# Patient Record
Sex: Male | Born: 1955
Health system: Southern US, Community
[De-identification: ages and names within clinical notes are randomized; demographics above are authoritative.]

## PROBLEM LIST (undated history)

## (undated) DIAGNOSIS — N401 Enlarged prostate with lower urinary tract symptoms: Secondary | ICD-10-CM

## (undated) DIAGNOSIS — Z9889 Other specified postprocedural states: Secondary | ICD-10-CM

## (undated) DIAGNOSIS — Z8489 Family history of other specified conditions: Secondary | ICD-10-CM

## (undated) DIAGNOSIS — Z973 Presence of spectacles and contact lenses: Secondary | ICD-10-CM

## (undated) DIAGNOSIS — R112 Nausea with vomiting, unspecified: Secondary | ICD-10-CM

## (undated) DIAGNOSIS — R351 Nocturia: Secondary | ICD-10-CM

## (undated) DIAGNOSIS — J9859 Other diseases of mediastinum, not elsewhere classified: Principal | ICD-10-CM

## (undated) DIAGNOSIS — E669 Obesity, unspecified: Secondary | ICD-10-CM

## (undated) DIAGNOSIS — C61 Malignant neoplasm of prostate: Secondary | ICD-10-CM

## (undated) HISTORY — DX: Other diseases of mediastinum, not elsewhere classified: J98.59

---

## 1995-12-05 HISTORY — PX: LAPAROSCOPIC CHOLECYSTECTOMY: SUR755

## 2002-12-04 HISTORY — PX: KNEE ARTHROSCOPY: SUR90

## 2011-12-05 HISTORY — PX: OTHER SURGICAL HISTORY: SHX169

## 2016-01-27 ENCOUNTER — Ambulatory Visit: Payer: BLUE CROSS/BLUE SHIELD | Admitting: Radiation Oncology

## 2016-02-02 ENCOUNTER — Encounter: Payer: Self-pay | Admitting: Radiation Oncology

## 2016-02-02 NOTE — Progress Notes (Signed)
GU Location of Tumor / Histology: prostatic adenocarcinoma  If Prostate Cancer, Gleason Score is (3 + 3) and PSA is (9.6) November 2016  Joe Parrish reports some 15 years ago on a yearly basis his employer checked his PSA and it was elevated then but, he doesn't recall the values.  Biopsies of prostate (if applicable) revealed:    Past/Anticipated interventions by urology, if any: biopsy and referral to Dr. Tammi Klippel  Past/Anticipated interventions by medical oncology, if any: no  Weight changes, if any: no  Bowel/Bladder complaints, if any: nocturia x1 with rapaflo, reports leakage resolved with rapaflo, denies dysuria or hematuria.   Nausea/Vomiting, if any: no  Pain issues, if any:  no  SAFETY ISSUES:  Prior radiation? no  Pacemaker/ICD? no  Possible current pregnancy? no  Is the patient on methotrexate? no  Current Complaints / other details:  60 year old male. Married. Patient most interested in seeds. States, "I become deathly ill after being put to sleep."  PROSTATE VOLUME: 92.98 cc

## 2016-02-03 ENCOUNTER — Ambulatory Visit
Admission: RE | Admit: 2016-02-03 | Discharge: 2016-02-03 | Disposition: A | Discharge status: Home / Self Care | Payer: BLUE CROSS/BLUE SHIELD | Source: Ambulatory Visit | Attending: Radiation Oncology | Admitting: Radiation Oncology

## 2016-02-03 ENCOUNTER — Encounter: Payer: Self-pay | Admitting: Radiation Oncology

## 2016-02-03 VITALS — BP 147/90 | HR 62 | Resp 16 | Ht 75.0 in | Wt 293.8 lb

## 2016-02-03 DIAGNOSIS — C61 Malignant neoplasm of prostate: Secondary | ICD-10-CM | POA: Insufficient documentation

## 2016-02-03 HISTORY — DX: Malignant neoplasm of prostate: C61

## 2016-02-03 NOTE — Progress Notes (Signed)
See progress note under physician encounter. 

## 2016-02-03 NOTE — Progress Notes (Signed)
Radiation Oncology         [phone] ________________________________  Initial Outpatient Consultation  Name: Joe Parrish MRN: [id number]  Date: 02/03/2016  DOB: 11-01-1956  CC:No primary care provider on file.  Kathie Rhodes, MD   REFERRING PHYSICIAN: Kathie Rhodes, MD  DIAGNOSIS: 60 y.o. gentleman with stage T1c adenocarcinoma of the prostate with a Gleason's score of 3+3 and a PSA of 9.6.    ICD-9-CM ICD-10-CM   1. Malignant neoplasm of prostate ([place]) [place] ILLNESS:Joe Parrish is a 60 y.o. gentleman.  He was noted to have an elevated PSA of 9.6 by his primary care physician, Dr. Burnett Sheng.  Accordingly, he was referred for evaluation in urology by Dr. Karsten Ro on 01/06/16,  digital rectal examination was performed at that time did not reveal any nodules.  The patient proceeded to transrectal ultrasound with 12 biopsies of the prostate on 01/12/16.  The prostate volume measured 92.98 cc.  Out of 12 core biopsies, 2 were positive.  The maximum Gleason score was 3+3, and this was seen in the left mid lateral and left apex lateral.  The patient reviewed the biopsy results with his urologist and he has kindly been referred today for discussion of potential radiation treatment options.  PREVIOUS RADIATION THERAPY: No  PAST MEDICAL HISTORY:  Past Medical History  Diagnosis Date  . Prostate cancer [facility])       PAST SURGICAL HISTORY: Past Surgical History  Procedure Laterality Date  . Knee arthroscopy    . Cholecystectomy      FAMILY HISTORY:  Family History  Problem Relation Age of Onset  . Cancer Mother     colon received radiation therapy  . Cancer Other     neice receiving radiation therapy     SOCIAL HISTORY:  reports that he has never smoked. He has never used smokeless tobacco. He reports that he does not drink alcohol or use illicit drugs.  ALLERGIES: Codeine  MEDICATIONS:  Current Outpatient Prescriptions  Medication Sig Dispense  Refill  . silodosin (RAPAFLO) 8 MG CAPS capsule Take 8 mg by mouth daily with breakfast.     No current facility-administered medications for this encounter.    REVIEW OF SYSTEMS:  On review of systems, the patient reports that he is doing well. He denies any chest pain or shortness of breath, fevers or chills. He denies any unintended weight changes. He is not experiencing any chronic cough, nausea, vomiting, difficulty with bowel dysfunction. He is not experiencing any abdominal pain. The patient completed an IPSS and IIEF questionnaire.  His IPSS score was 10 indicating moderate urinary outflow obstructive symptoms, when asked the patient reports that he scored this based on his pre-Flomax/Rapaflo use, though he states that prior to the use of these medications, he did have nocturia 3x, though now he reports once.  He indicated that his erectile function is able to complete sexual activity most attempts. A complete review of systems is obtained and is otherwise negative.  PHYSICAL EXAM:   height is 6\' 3"  (1.905 m) and weight is 293 lb 12.8 oz (133.267 kg). His blood pressure is 147/90 and his pulse is 62. His respiration is 16 and oxygen saturation is 100%.  Pain scale 0/10 In general this is a well appearing Caucasian male in no acute distress. He is alert and oriented x4 and appropriate throughout the examination. HEENT reveals that the patient is normocephalic, atraumatic. EOMs are intact. PERRLA. Skin is intact without  any evidence of gross lesions. Cardiovascular exam reveals a regular rate and rhythm, no clicks rubs or murmurs are auscultated. Chest is clear to auscultation bilaterally. Lymphatic assessment is performed and does not reveal any adenopathy in the cervical, supraclavicular, axillary, or inguinal chains. Abdomen has active bowel sounds in all quadrants and is intact. The abdomen is soft, non tender, non distended. Lower extremities are negative for pretibial pitting edema, deep calf  tenderness, cyanosis or clubbing.  KPS = 100  100 - Normal; no complaints; no evidence of disease. 90   - Able to carry on normal activity; minor signs or symptoms of disease. 80   - Normal activity with effort; some signs or symptoms of disease. 47   - Cares for self; unable to carry on normal activity or to do active work. 60   - Requires occasional assistance, but is able to care for most of his personal needs. 50   - Requires considerable assistance and frequent medical care. 86   - Disabled; requires special care and assistance. 19   - Severely disabled; hospital admission is indicated although death not imminent. 47   - Very sick; hospital admission necessary; active supportive treatment necessary. 10   - Moribund; fatal processes progressing rapidly. 0     - Dead  Karnofsky DA, Abelmann WH, Craver LS and Burchenal JH [phone]) The use of the nitrogen mustards in the palliative treatment of carcinoma: with particular reference to bronchogenic carcinoma Cancer 1 634-56   LABORATORY DATA:  No results found for: WBC, HGB, HCT, MCV, PLT No results found for: NA, K, CL, CO2 No results found for: ALT, AST, GGT, ALKPHOS, BILITOT   RADIOGRAPHY: No results found.    IMPRESSION: This gentleman is a 60 y.o. gentleman with stage T1c adenocarcinoma of the prostate with a Gleason's score of 3+3 and a PSA of 9.6.  His T-Stage, Gleason's Score, and PSA put him into the low risk group.  Accordingly he is eligible for a variety of potential treatment options.  PLAN: Today Dr. Tammi Klippel reviewed the findings and workup thus far.  We discussed the natural history of prostate cancer.  We reviewed the the implications of T-stage, Gleason's Score, and PSA on decision-making and outcomes in prostate cancer.  We discussed radiation treatment in the management of prostate cancer with regard to the logistics and delivery of options of radiotherapy. Currently the patient's prostatic volume excludes him from seed  implant eligibility. Dr. Tammi Klippel discusses that his options would include active surveillance, prostatectomy, external radiotherapy, or the use of ADT to shrink the prostate, and possibly at a later date consider seed implant if his volume were to decrease close to the 60cc range. The patient verbalizes that surveillance, surgery due to down time, and external radiotherapy are not options he would consider. He would like to proceed with ADT and seed implant if feasible.  We discussed the side effect profile of ADT injections, and the rationale for their use. He is interested despite these side effects, and we will contact Dr. Simone Curia office to keep them informed of the patient's decision. Dr. Tammi Klippel discusses that it will likely take 3-4 months before his prostatic size would be at an appropriate size for implant, and that a repeat ultrasound would have to be performed to confirm this. The patient is counseled on the side effects and expectations regarding seed implant. We will plan to see him back in 4 or so months to confirm he is ready to proceed.  We  enjoyed meeting with him today, and will look forward to participating in the care of this very nice gentleman.  The above documentation reflects my direct findings during this shared patient visit. Please see the separate note by Dr. Tammi Klippel on this date for the remainder of the patient's plan of care.  Carola Rhine, PAC   This document serves as a record of services personally performed by Shona Simpson, PAC and  Tyler Pita, MD. It was created on their behalf by Darcus Austin, a trained medical scribe. The creation of this record is based on the scribe's personal observations and the provider's statements to them. This document has been checked and approved by the attending provider.

## 2016-02-03 NOTE — Patient Instructions (Signed)
Contact our office if you have any questions following today's appointment: [phone].  

## 2016-05-16 DIAGNOSIS — C61 Malignant neoplasm of prostate: Secondary | ICD-10-CM | POA: Diagnosis not present

## 2016-05-22 ENCOUNTER — Ambulatory Visit
Admission: RE | Admit: 2016-05-22 | Discharge: 2016-05-22 | Disposition: A | Discharge status: Home / Self Care | Payer: BLUE CROSS/BLUE SHIELD | Source: Ambulatory Visit | Attending: Radiation Oncology | Admitting: Radiation Oncology

## 2016-05-22 ENCOUNTER — Encounter: Payer: Self-pay | Admitting: Medical Oncology

## 2016-05-22 ENCOUNTER — Encounter: Payer: Self-pay | Admitting: Radiation Oncology

## 2016-05-22 VITALS — BP 139/92 | HR 96 | Resp 16 | Wt 297.8 lb

## 2016-05-22 DIAGNOSIS — C61 Malignant neoplasm of prostate: Secondary | ICD-10-CM | POA: Insufficient documentation

## 2016-05-22 NOTE — Progress Notes (Signed)
Oncology Nurse Navigator Documentation  Oncology Nurse Navigator Flowsheets 05/22/2016  Navigator Location [facility]- I met Joe Parrish and his wife today and introduced myself as the Prostate Navigator. I discussed with them my role and gave them my business card.I asked them to call me with any questions or concerns.  Abnormal Finding Date 01/06/2016  Confirmed Diagnosis Date 01/12/2016  Treatment Initiated Date 02/10/2016  Patient Visit Type RadOnc  Treatment Phase Pre-Tx/Tx Discussion- Joe Parrish was seen by Dr. Tammi Klippel 02/03/16. After treatment options were discussed patient opted for brachytherapy. Due to size of prostate he was given Lupron to help decrease the size of his prostate. He returns today to discuss brachytherapy with Dr. Tammi Klippel. I will continue to follow [name] Mom Needs No barriers at this time  Interventions None required  Support Groups/Services Prostate Support Group;Friends and Family  Acuity Level 1  Acuity Level 1 Initial guidance, education and coordination as needed  Time Spent with Patient 30

## 2016-05-22 NOTE — Progress Notes (Signed)
See progress note under physician encounter. 

## 2016-05-22 NOTE — Progress Notes (Signed)
GU Location of Tumor / Histology: prostatic adenocarcinoma   If Prostate Cancer, Gleason Score is (3 + 3) and PSA is (9.6) November 2016  Joe Parrish reports some 15 years ago on a yearly basis his employer checked his PSA and it was elevated then but, he doesn't recall the values.   Biopsies of prostate (if applicable) revealed:   Past/Anticipated interventions by urology, if any: biopsy, referral to Dr. Tammi Klippel, refer back to Dr. Karsten Ro for ADT (Lupron 3 month injection given 02/10/16)   Past/Anticipated interventions by medical oncology, if any: no  Weight changes, if any: no  Bowel/Bladder complaints, if any: nocturia x1 with rapaflo, reports leakage resolved with rapaflo, denies dysuria or hematuria.  Nausea/Vomiting, if any: no  Pain issues, if any: no   SAFETY ISSUES:  Prior radiation? no  Pacemaker/ICD? no  Possible current pregnancy? no  Is the patient on methotrexate? No  Current Complaints / other details: 60 year old male. Married. Patient most interested in seeds. States, "I become deathly ill after being put to sleep."   INITIALLY PROSTATE VOLUME: 92.98 cc AFTER ADT PROSTATE VOLUME IS 70 CC.     Reports since starting ADT he has experience hot flashes and difficulty sleeping. Reports nocturia up to 11 times with water retention since starting ADT. Reports nocturia x 1 if he holds fluids two hours prior to bed time.

## 2016-05-22 NOTE — Progress Notes (Addendum)
Radiation Oncology         [phone] ________________________________  Follow Up New  Name: Joe Parrish MRN: [id number]  Date: 05/22/2016  DOB: [birth year]  CC:No primary care provider on file.  Kathie Rhodes, MD   REFERRING PHYSICIAN: Kathie Rhodes, MD  DIAGNOSIS: 60 y.o. gentleman with stage T1c adenocarcinoma of the prostate with a Gleason's score of 3+3 and a PSA of 9.6.    ICD-9-CM ICD-10-CM   1. Malignant neoplasm of prostate ([place]) [name]:Joe Parrish is a 60 y.o. gentleman who was originially found to have an elevated PSA of 9.6 by his primary care physician, Dr. Burnett Sheng.  Accordingly, he was referred for evaluation in urology by Dr. Karsten Ro on 01/06/16,  digital rectal examination was performed at that time did not reveal any nodules.  The patient proceeded to transrectal ultrasound with 12 biopsies of the prostate on 01/12/16.  The prostate volume measured 92.98 cc.  Out of 12 core biopsies, 2 were positive.  The maximum Gleason score was 3+3, and this was seen in the left mid lateral and left apex lateral.  The patient met with Dr. Tammi Klippel on 3/10/7 and at that time, they discussed the options for radiotherapy. The patient was interested in radioactive seed implant; his gland size was too large to proceed, and he has gone one to receive Lupron to try to reduce the size of the gland in anticipation of seed placement. He received Lupron 30 mg in March 2017 and repeat ultrasound to assess his gland volume revealed that this was 70 cc. He comes today to discuss options further for radiotherapy.  PREVIOUS RADIATION THERAPY: No  PAST MEDICAL HISTORY:  Past Medical History  Diagnosis Date  . Prostate cancer [facility])       PAST SURGICAL HISTORY: Past Surgical History  Procedure Laterality Date  . Knee arthroscopy    . Cholecystectomy      FAMILY HISTORY:  Family History  Problem Relation Age of Onset  . Cancer Mother     colon received  radiation therapy  . Cancer Other     neice receiving radiation therapy     SOCIAL HISTORY:  reports that he has never smoked. He has never used smokeless tobacco. He reports that he does not drink alcohol or use illicit drugs. The patient is married and accompanied by his wife and grandson. He takes care of his grandson during the day.  ALLERGIES: Codeine  MEDICATIONS:  Current Outpatient Prescriptions  Medication Sig Dispense Refill  . tamsulosin (FLOMAX) 0.4 MG CAPS capsule Take 0.4 mg by mouth.     No current facility-administered medications for this encounter.    REVIEW OF SYSTEMS:  On review of systems, the patient reports that he is doing well. He denies any chest pain or shortness of breath, fevers or chills. He denies any unintended weight changes. He is not experiencing any chronic cough, nausea, vomiting, difficulty with bowel dysfunction. He is not experiencing any abdominal pain. The patient completed an IPSS and IIEF questionnaire.  His IPSS score was 10 indicating moderate urinary outflow obstructive symptoms, when asked the patient reports that he scored this based on his pre-Flomax/Rapaflo use, though he states that prior to the use of these medications, he did have nocturia 3x, though now he reports once.  He indicated that his erectile function is able to complete sexual activity most attempts. A complete review of systems is obtained and is otherwise negative.  PHYSICAL  EXAM:   weight is 297 lb 12.8 oz (135.081 kg). His blood pressure is 139/92 and his pulse is 96. His respiration is 16 and oxygen saturation is 97%.  Pain scale 0/10 In general this is a well appearing Caucasian male in no acute distress. He's alert and oriented x4 and appropriate throughout the examination. Cardiopulmonary assessment is negative for acute distress and he exhibits normal effort.   KPS = 100  100 - Normal; no complaints; no evidence of disease. 90   - Able to carry on normal activity;  minor signs or symptoms of disease. 80   - Normal activity with effort; some signs or symptoms of disease. 9   - Cares for self; unable to carry on normal activity or to do active work. 60   - Requires occasional assistance, but is able to care for most of his personal needs. 50   - Requires considerable assistance and frequent medical care. 59   - Disabled; requires special care and assistance. 79   - Severely disabled; hospital admission is indicated although death not imminent. 34   - Very sick; hospital admission necessary; active supportive treatment necessary. 10   - Moribund; fatal processes progressing rapidly. 0     - Dead  Karnofsky DA, Abelmann WH, Craver LS and Burchenal JH [phone]) The use of the nitrogen mustards in the palliative treatment of carcinoma: with particular reference to bronchogenic carcinoma Cancer 1 634-56   LABORATORY DATA:  No results found for: WBC, HGB, HCT, MCV, PLT No results found for: NA, K, CL, CO2 No results found for: ALT, AST, GGT, ALKPHOS, BILITOT   RADIOGRAPHY: No results found.    IMPRESSION: This gentleman is a 60 y.o. gentleman with stage T1c adenocarcinoma of the prostate with a Gleason's score of 3+3 and a PSA of 9.6.  His T-Stage, Gleason's Score, and PSA put him into the low risk group.  Accordingly he is eligible for a variety of potential treatment options.  PLAN:  Dr. Tammi Klippel met back with the patient and his wife today and discusses the most recent imaging findings from his TRUS as well as  reviewed the the implications of T-stage, Gleason's Score, and PSA on decision-making and outcomes in prostate cancer once again.  He discussed radiation treatment in the management of prostate cancer with regard to the logistics and delivery of options of radiotherapy. It appears that ADT has helped with volume reduction of the prostate, and Dr. Tammi Klippel discusses that there may be a need to administer one additional injection of ADT in anticipation of  keeping his gland size stable as his seed implant would be scheduled for several weeks from now. We are looking at August 2017 to be able to perform this. The patient states agreement and understanding.  We discussed the logistics for proceeding and would need to perform a pubic arch study to confirm he is still anatomically a candidate. He states agreement and understanding. We discussed the risks, benefits, recovery time, as well as short and long term effects of radioactive seed implant of the prostate. The patient is interested in proceeding and we will begin the process.    The above documentation reflects my direct findings during this shared patient visit. Please see the separate note by Dr. Tammi Klippel on this date for the remainder of the patient's plan of care.  Carola Rhine, PAC

## 2016-05-23 ENCOUNTER — Other Ambulatory Visit: Payer: Self-pay | Admitting: Urology

## 2016-05-23 ENCOUNTER — Telehealth: Payer: Self-pay | Admitting: *Deleted

## 2016-05-23 NOTE — Telephone Encounter (Signed)
Called patient to pre-seed appt. And implant date, lvm for a return call

## 2016-05-25 NOTE — Addendum Note (Signed)
Encounter addended by: Heywood Footman, RN on: 05/25/2016  9:05 AM<BR>     Documentation filed: Charges VN

## 2016-05-31 ENCOUNTER — Telehealth: Payer: Self-pay | Admitting: *Deleted

## 2016-05-31 NOTE — Telephone Encounter (Signed)
CALLED PATIENT TO REMIND OF PRE-SEED APPT. FOR 06-01-16, SPOKE WITH PATIENT'S WIFE SUZANNE AND SHE IS AWARE OF THESE APPTS.

## 2016-06-01 ENCOUNTER — Ambulatory Visit
Admission: RE | Admit: 2016-06-01 | Discharge: 2016-06-01 | Disposition: A | Discharge status: Home / Self Care | Payer: BLUE CROSS/BLUE SHIELD | Source: Ambulatory Visit | Attending: Radiation Oncology | Admitting: Radiation Oncology

## 2016-06-01 ENCOUNTER — Ambulatory Visit (HOSPITAL_BASED_OUTPATIENT_CLINIC_OR_DEPARTMENT_OTHER)
Admission: RE | Admit: 2016-06-01 | Discharge: 2016-06-01 | Disposition: A | Discharge status: Home / Self Care | Payer: BLUE CROSS/BLUE SHIELD | Source: Ambulatory Visit | Attending: Urology | Admitting: Urology

## 2016-06-01 ENCOUNTER — Encounter (HOSPITAL_BASED_OUTPATIENT_CLINIC_OR_DEPARTMENT_OTHER)
Admission: RE | Admit: 2016-06-01 | Discharge: 2016-06-01 | Disposition: A | Discharge status: Home / Self Care | Payer: BLUE CROSS/BLUE SHIELD | Source: Ambulatory Visit | Attending: Urology | Admitting: Urology

## 2016-06-01 DIAGNOSIS — C61 Malignant neoplasm of prostate: Secondary | ICD-10-CM | POA: Insufficient documentation

## 2016-06-01 DIAGNOSIS — J9859 Other diseases of mediastinum, not elsewhere classified: Secondary | ICD-10-CM | POA: Insufficient documentation

## 2016-06-01 DIAGNOSIS — R918 Other nonspecific abnormal finding of lung field: Secondary | ICD-10-CM | POA: Diagnosis not present

## 2016-06-01 HISTORY — DX: Other diseases of mediastinum, not elsewhere classified: J98.59

## 2016-06-01 NOTE — Progress Notes (Signed)
  Radiation Oncology         [phone] ________________________________  Name: Leor Dinse MRN: [id number]  Date: 06/01/2016  DOB: 01-19-1956  SIMULATION AND TREATMENT PLANNING NOTE PUBIC ARCH STUDY  CC:No primary care provider on file.  Kathie Rhodes, MD  DIAGNOSIS: 60 y.o. gentleman with stage T1c adenocarcinoma of the prostate with a Gleason's score of 3+3 and a PSA of 9.6.     ICD-9-CM ICD-10-CM   1. Malignant neoplasm of prostate ([place]) [place]:  The patient presented today for evaluation for possible prostate seed implant. He was brought to the radiation planning suite and placed supine on the CT couch. A 3-dimensional image study set was obtained in upload to the planning computer. There, on each axial slice, I contoured the prostate gland. Then, using three-dimensional radiation planning tools I reconstructed the prostate in view of the structures from the transperineal needle pathway to assess for possible pubic arch interference. In doing so, I did not appreciate any pubic arch interference. Also, the patient's prostate volume was estimated based on the drawn structure. The volume was 62 cc.  Given the pubic arch appearance and prostate volume, patient remains a good candidate to proceed with prostate seed implant. Today, he freely provided informed written consent to proceed.    PLAN: The patient will undergo prostate seed implant.   ________________________________  Sheral Apley. Tammi Klippel, M.D.

## 2016-06-14 DIAGNOSIS — R918 Other nonspecific abnormal finding of lung field: Secondary | ICD-10-CM | POA: Diagnosis not present

## 2016-06-14 DIAGNOSIS — C61 Malignant neoplasm of prostate: Secondary | ICD-10-CM | POA: Diagnosis not present

## 2016-06-15 ENCOUNTER — Other Ambulatory Visit: Payer: Self-pay | Admitting: Urology

## 2016-06-15 DIAGNOSIS — R918 Other nonspecific abnormal finding of lung field: Secondary | ICD-10-CM

## 2016-06-23 ENCOUNTER — Ambulatory Visit
Admission: RE | Admit: 2016-06-23 | Discharge: 2016-06-23 | Disposition: A | Discharge status: Home / Self Care | Payer: BLUE CROSS/BLUE SHIELD | Source: Ambulatory Visit | Attending: Urology | Admitting: Urology

## 2016-06-23 ENCOUNTER — Other Ambulatory Visit: Payer: Self-pay | Admitting: Urology

## 2016-06-23 DIAGNOSIS — Z01818 Encounter for other preprocedural examination: Secondary | ICD-10-CM | POA: Diagnosis not present

## 2016-06-23 DIAGNOSIS — T7589XA Other specified effects of external causes, initial encounter: Secondary | ICD-10-CM

## 2016-06-23 DIAGNOSIS — R918 Other nonspecific abnormal finding of lung field: Secondary | ICD-10-CM

## 2016-06-23 MED FILL — diazePAM 10 MG TABS: 10 | 1 days supply | Qty: 1 | Fill #0

## 2016-07-04 ENCOUNTER — Other Ambulatory Visit: Payer: BLUE CROSS/BLUE SHIELD

## 2016-07-04 DIAGNOSIS — J069 Acute upper respiratory infection, unspecified: Secondary | ICD-10-CM | POA: Diagnosis not present

## 2016-07-05 ENCOUNTER — Other Ambulatory Visit: Payer: Self-pay

## 2016-07-11 ENCOUNTER — Encounter (HOSPITAL_BASED_OUTPATIENT_CLINIC_OR_DEPARTMENT_OTHER): Payer: Self-pay | Admitting: *Deleted

## 2016-07-11 DIAGNOSIS — C61 Malignant neoplasm of prostate: Secondary | ICD-10-CM | POA: Diagnosis not present

## 2016-07-11 DIAGNOSIS — R3915 Urgency of urination: Secondary | ICD-10-CM | POA: Diagnosis not present

## 2016-07-11 DIAGNOSIS — M1990 Unspecified osteoarthritis, unspecified site: Secondary | ICD-10-CM | POA: Diagnosis not present

## 2016-07-11 DIAGNOSIS — N401 Enlarged prostate with lower urinary tract symptoms: Secondary | ICD-10-CM | POA: Diagnosis not present

## 2016-07-11 LAB — CBC
HCT: 45.8 % (ref 39.0–52.0)
Hemoglobin: 15.2 g/dL (ref 13.0–17.0)
MCH: 30.9 pg (ref 26.0–34.0)
MCHC: 33.2 g/dL (ref 30.0–36.0)
MCV: 93.1 fL (ref 78.0–100.0)
Platelets: 276 10*3/uL (ref 150–400)
RBC: 4.92 MIL/uL (ref 4.22–5.81)
RDW: 13.8 % (ref 11.5–15.5)
WBC: 7.3 10*3/uL (ref 4.0–10.5)

## 2016-07-11 LAB — COMPREHENSIVE METABOLIC PANEL
ALT: 25 U/L (ref 17–63)
AST: 19 U/L (ref 15–41)
Albumin: 4.5 g/dL (ref 3.5–5.0)
Alkaline Phosphatase: 94 U/L (ref 38–126)
Anion gap: 9 (ref 5–15)
BUN: 15 mg/dL (ref 6–20)
CO2: 29 mmol/L (ref 22–32)
Calcium: 9.5 mg/dL (ref 8.9–10.3)
Chloride: 102 mmol/L (ref 101–111)
Creatinine, Ser: 1.03 mg/dL (ref 0.61–1.24)
GFR calc Af Amer: >60 mL/min (ref >60)
GFR calc non Af Amer: >60 mL/min (ref >60)
Glucose, Bld: 118 mg/dL — ABNORMAL HIGH (ref 65–99)
Potassium: 4.4 mmol/L (ref 3.5–5.1)
Sodium: 140 mmol/L (ref 135–145)
Total Bilirubin: 0.7 mg/dL (ref 0.3–1.2)
Total Protein: 7.6 g/dL (ref 6.5–8.1)

## 2016-07-11 LAB — APTT: aPTT: 32 seconds (ref 24–36)

## 2016-07-11 LAB — PROTIME-INR
INR: 1.02
Prothrombin Time: 13.4 seconds (ref 11.4–15.2)

## 2016-07-11 NOTE — Progress Notes (Signed)
NPO AFTER MN.  ARRIVE AT 0900.  CURRENT LAB RESULTS, EKG, AND CXR IN CHART AND EPIC.  WILL DO HIBICLENS SHOWER AM DOS.

## 2016-07-12 ENCOUNTER — Ambulatory Visit
Admission: RE | Admit: 2016-07-12 | Discharge: 2016-07-12 | Disposition: A | Discharge status: Home / Self Care | Payer: BLUE CROSS/BLUE SHIELD | Source: Ambulatory Visit | Attending: Urology | Admitting: Urology

## 2016-07-12 DIAGNOSIS — R222 Localized swelling, mass and lump, trunk: Secondary | ICD-10-CM | POA: Diagnosis not present

## 2016-07-12 MED ORDER — GADOBENATE DIMEGLUMINE 529 MG/ML IV SOLN
20.0000 mL | Freq: Once | INTRAVENOUS | Status: AC | PRN
  Administered 2016-07-12: 20 mL via INTRAVENOUS

## 2016-07-14 ENCOUNTER — Ambulatory Visit: Admission: RE | Admit: 2016-07-14 | Payer: BLUE CROSS/BLUE SHIELD | Source: Ambulatory Visit

## 2016-07-14 DIAGNOSIS — C61 Malignant neoplasm of prostate: Secondary | ICD-10-CM | POA: Diagnosis not present

## 2016-07-16 NOTE — H&P (Signed)
HPI: Joe Parrish is a 60 year-old male established patient with prostate cancer.  His prostate cancer was diagnosed 01/12/2016. His PSA at his time of diagnosis was 9.6.   He has not undergone surgery for treatment. He has not undergone External Beam Radiation Therapy for treatment. He has undergone Hormonal Therapy for treatment.   Because of his prostate volume of 93 cc he received a 3 month Lupron injection on 02/10/16 in order to downsize his prostate in preparation for radioactive seed implantation.   He said his greatest complaint is that the Lupron has caused difficulty sleeping at night. He had contacted the office and I recommended the use of Benadryl and he took this and noted it resulted in improvement in his ability to fall sleep but he read on the bottle but it should not be taken by man with prostate conditions and stopped taking the medication. He indicated however that he did not notice any worsening of his voiding symptoms when he took the medication.     ALLERGIES: Codeine Derivatives    MEDICATIONS: Rapaflo 8 MG Oral Capsule 0 Oral  Tamsulosin HCl - 0.4 MG Oral Capsule 0 Oral     GU PSH: None     PSH Notes: Arthroscopy Knee, Cholecystectomy, Colonoscopy (Fiberoptic)   NON-GU PSH: Cholecystectomy - 01/06/2016 Diagnostic Colonoscopy - 01/06/2016 Knee Arthroscopy; Dx - 01/06/2016    GU PMH: Prostate Cancer, Adenocarcinoma of prostate - 02/10/2016 BPH w/LUTS, Benign prostatic hyperplasia (BPH) with urinary urgency - 01/12/2016 Elevated PSA, Elevated PSA - 01/12/2016      PMH Notes: Adenocarcinoma of the prostate: He was noted have a PSA of 9.6 in 11/16. A previous PSA in 2012 was 4.3. He reported that he used to have his PSA done by his employer on a yearly basis and he said he believes that 15 years ago his PSA was elevated but he does not recall values. That information is no longer available.  TRUS/BX 01/12/16: Prostate volume - 93 cc  Pathology: Adenocarcinoma Gleason score 3+3  = 6 in 2 cores from the right lobe involving 20% and 5% of the cores.  Stage: T1c     BPH with LUTS: He does have some mild voiding symptoms consisting of some urgency occasionally when he hears water and a weak stream at night but a good strong stream throughout the day. He said in the evenings if he drinks water he will not get up at all at night. Other liquids will cause him to have to get up. He also has some mild postvoid dribbling.     NON-GU PMH: Encounter for general adult medical examination without abnormal findings, Encounter for preventive health examination - 01/24/2016 Personal history of colonic polyps, History of colonic polyps - 01/06/2016 Personal history of other diseases of the musculoskeletal system and connective tissue, History of arthritis - 01/06/2016    FAMILY HISTORY: Colon Cancer - Runs In Family Death - Runs In Family   SOCIAL HISTORY: Marital Status: Married Patient has never smoked.  Has never drank.  Drinks 1 caffeinated drink per day.     Notes: Alcohol use, Caffeine use, Married, Never a smoker   REVIEW OF SYSTEMS:    GU Review Male:   Patient reports get up at night to urinate and leakage of urine. Patient denies frequent urination, hard to postpone urination, burning/ pain with urination, stream starts and stops, trouble starting your stream, have to strain to urinate , erection problems, and penile pain.  Gastrointestinal (Upper):  Patient denies vomiting, nausea, and indigestion/ heartburn.  Gastrointestinal (Lower):   Patient denies diarrhea and constipation.  Constitutional:   Patient denies fever, night sweats, weight loss, and fatigue.  Skin:   Patient denies skin rash/ lesion and itching.  Eyes:   Patient denies blurred vision and double vision.  Ears/ Nose/ Throat:   Patient denies sore throat and sinus problems.  Hematologic/Lymphatic:   Patient denies swollen glands and easy bruising.  Cardiovascular:   Patient denies leg swelling and chest  pains.  Respiratory:   Patient denies cough and shortness of breath.  Endocrine:   Patient denies excessive thirst.  Musculoskeletal:   Patient reports joint pain. Patient denies back pain.  Neurological:   Patient denies headaches and dizziness.  Psychologic:   Patient denies depression and anxiety.   Notes: weight gain   VITAL SIGNS:    Weight: 29 lb/13.2 kg   Height/Length: 75 in / 190 cm   BP: 129/79 mmHg   Pulse: 77 /min   Temp: 97.8 F / 37 C   BMI: 3.6       Physical Exam  Constitutional: Well nourished and well developed . No acute distress.   ENT:. The ears and nose are normal in appearance.   Neck: The appearance of the neck is normal and no neck mass is present.   Pulmonary: No respiratory distress and normal respiratory rhythm and effort.   Cardiovascular: Heart rate and rhythm are normal . No peripheral edema.   Abdomen: The abdomen is soft and nontender. No masses are palpated. No CVA tenderness. No hernias are palpable. No hepatosplenomegaly noted.   Rectal: Rectal exam demonstrates normal sphincter tone, no tenderness and no masses. The prostate is smooth and flat . Estimated prostate size is 2+. The prostate has no nodularity and is not tender. The left seminal vesicle is nonpalpable. The right seminal vesicle is nonpalpable. The perineum is normal on inspection.   Genitourinary: Examination of the penis demonstrates no discharge, no masses, no lesions and a normal meatus. The penis is uncircumcised. The scrotum is without lesions. The right epididymis is palpably normal and non-tender. The left epididymis is palpably normal and non-tender. The right testis is non-tender and without masses. The left testis is non-tender and without masses.   Lymphatics: The femoral and inguinal nodes are not enlarged or tender.   Skin: Normal skin turgor, no visible rash and no visible skin lesions.   Neuro/Psych:. Mood and affect are appropriate.    PAST DATA REVIEWED:   Source Of History:  Outside Source  Lab Test Review:   BUN, Creatinine and GFR  Records Review:   Previous Patient Records, POC Tool  Notes:                     I note on his POC that his Creatinine in 11/16 was 1.01   PROCEDURES:         Prostate Ultrasound done 05/16/16 Length: 5.58cm Height: 4.05cm Width: 5.96cm Volume: 70.5 ML  Bladder:    Prostate:  Subcentimeter cystic area seen as well as calcifications.       The transrectal ultrasound probe is introduced into the rectum, and the prostate is visualized. Ultrasonography is utilized throughout the procedure. At the conclusion of the procedure, the ultrasound probe is removed. The patient tolerates the procedure without complication.   ASSESSMENT:      ICD-10 Details  1 GU:   Prostate Cancer - C61 Stable - He is on Lupron  so we discussed checking his PSA and testosterone level.   2   BPH w/LUTS - N40.1 Stable - his voiding symptoms were not worsened by Benadryl so I told him he could restart that.  His prostate is 70 cc. He should be implantable.   We discussed the fact that as the Lupron effect wear off he would begin to have less difficulty sleeping at night but in the meantime I did tell him he could take the Benadryl. In addition he asked about his libido and I told him that that would return as well. I am going to check a serum testosterone todayto serve as a baseline.    PLAN:     I-125 seed implant

## 2016-07-17 ENCOUNTER — Ambulatory Visit (HOSPITAL_COMMUNITY): Payer: BLUE CROSS/BLUE SHIELD

## 2016-07-17 ENCOUNTER — Encounter (HOSPITAL_BASED_OUTPATIENT_CLINIC_OR_DEPARTMENT_OTHER)
Admission: RE | Disposition: A | Discharge status: Home / Self Care | Payer: Self-pay | Source: Ambulatory Visit | Attending: Urology

## 2016-07-17 ENCOUNTER — Ambulatory Visit (HOSPITAL_BASED_OUTPATIENT_CLINIC_OR_DEPARTMENT_OTHER): Payer: BLUE CROSS/BLUE SHIELD | Admitting: Anesthesiology

## 2016-07-17 ENCOUNTER — Ambulatory Visit (HOSPITAL_BASED_OUTPATIENT_CLINIC_OR_DEPARTMENT_OTHER)
Admission: RE | Admit: 2016-07-17 | Discharge: 2016-07-17 | Disposition: A | Discharge status: Home / Self Care | Payer: BLUE CROSS/BLUE SHIELD | Source: Ambulatory Visit | Attending: Urology | Admitting: Urology

## 2016-07-17 ENCOUNTER — Encounter (HOSPITAL_BASED_OUTPATIENT_CLINIC_OR_DEPARTMENT_OTHER): Payer: Self-pay | Admitting: Certified Registered"

## 2016-07-17 DIAGNOSIS — C61 Malignant neoplasm of prostate: Secondary | ICD-10-CM | POA: Diagnosis not present

## 2016-07-17 DIAGNOSIS — N401 Enlarged prostate with lower urinary tract symptoms: Secondary | ICD-10-CM | POA: Insufficient documentation

## 2016-07-17 DIAGNOSIS — R3915 Urgency of urination: Secondary | ICD-10-CM | POA: Insufficient documentation

## 2016-07-17 DIAGNOSIS — M1990 Unspecified osteoarthritis, unspecified site: Secondary | ICD-10-CM | POA: Diagnosis not present

## 2016-07-17 HISTORY — DX: Other specified postprocedural states: Z98.890

## 2016-07-17 HISTORY — DX: Family history of other specified conditions: Z84.89

## 2016-07-17 HISTORY — PX: RADIOACTIVE SEED IMPLANT: SHX5150

## 2016-07-17 HISTORY — DX: Presence of spectacles and contact lenses: Z97.3

## 2016-07-17 HISTORY — DX: Benign prostatic hyperplasia with lower urinary tract symptoms: N40.1

## 2016-07-17 HISTORY — DX: Nausea with vomiting, unspecified: R11.2

## 2016-07-17 SURGERY — INSERTION, RADIATION SOURCE, PROSTATE
Anesthesia: General

## 2016-07-17 MED ORDER — LIDOCAINE HCL 1 % IJ SOLN
INTRAMUSCULAR | Status: DC | PRN
  Administered 2016-07-17: 100 mg

## 2016-07-17 MED ORDER — ONDANSETRON HCL 4 MG/2ML IJ SOLN
INTRAMUSCULAR | Status: AC
  Filled 2016-07-17: qty 2

## 2016-07-17 MED ORDER — PROPOFOL 10 MG/ML IV BOLUS
INTRAVENOUS | Status: AC
  Filled 2016-07-17: qty 40

## 2016-07-17 MED ORDER — SCOPOLAMINE 1 MG/3DAYS TD PT72
1.0000 | MEDICATED_PATCH | TRANSDERMAL | Status: DC
  Administered 2016-07-17: 1.5 mg via TRANSDERMAL
  Filled 2016-07-17: qty 1

## 2016-07-17 MED ORDER — ROCURONIUM BROMIDE 100 MG/10ML IV SOLN
INTRAVENOUS | Status: AC
  Filled 2016-07-17: qty 2

## 2016-07-17 MED ORDER — MIDAZOLAM HCL 5 MG/5ML IJ SOLN
INTRAMUSCULAR | Status: DC | PRN
  Administered 2016-07-17: 2 mg via INTRAVENOUS

## 2016-07-17 MED ORDER — PROPOFOL 500 MG/50ML IV EMUL
INTRAVENOUS | Status: AC
  Filled 2016-07-17: qty 50

## 2016-07-17 MED ORDER — SUGAMMADEX SODIUM 200 MG/2ML IV SOLN
INTRAVENOUS | Status: DC | PRN
  Administered 2016-07-17: 200 mg via INTRAVENOUS
  Administered 2016-07-17: 400 mg via INTRAVENOUS

## 2016-07-17 MED ORDER — CIPROFLOXACIN IN D5W 400 MG/200ML IV SOLN
400.0000 mg | INTRAVENOUS | Status: AC
  Administered 2016-07-17: 400 mg via INTRAVENOUS
  Filled 2016-07-17: qty 200

## 2016-07-17 MED ORDER — PROPOFOL 500 MG/50ML IV EMUL
INTRAVENOUS | Status: DC | PRN
  Administered 2016-07-17: 150 ug/kg/min via INTRAVENOUS

## 2016-07-17 MED ORDER — FENTANYL CITRATE (PF) 100 MCG/2ML IJ SOLN
INTRAMUSCULAR | Status: AC
  Filled 2016-07-17: qty 2

## 2016-07-17 MED ORDER — HYDROCODONE-ACETAMINOPHEN 10-325 MG PO TABS: 1.0000 | ORAL_TABLET | ORAL | 0 refills | Status: DC | PRN

## 2016-07-17 MED ORDER — CIPROFLOXACIN IN D5W 400 MG/200ML IV SOLN
INTRAVENOUS | Status: AC
  Filled 2016-07-17: qty 200

## 2016-07-17 MED ORDER — SCOPOLAMINE 1 MG/3DAYS TD PT72
MEDICATED_PATCH | TRANSDERMAL | Status: AC
  Filled 2016-07-17: qty 1

## 2016-07-17 MED ORDER — ONDANSETRON HCL 4 MG/2ML IJ SOLN
INTRAMUSCULAR | Status: DC | PRN
  Administered 2016-07-17 (×2): 4 mg via INTRAVENOUS

## 2016-07-17 MED ORDER — MIDAZOLAM HCL 2 MG/2ML IJ SOLN
INTRAMUSCULAR | Status: AC
  Filled 2016-07-17: qty 2

## 2016-07-17 MED ORDER — FENTANYL CITRATE (PF) 100 MCG/2ML IJ SOLN
INTRAMUSCULAR | Status: DC | PRN
  Administered 2016-07-17: 50 ug via INTRAVENOUS

## 2016-07-17 MED ORDER — STERILE WATER FOR IRRIGATION IR SOLN
Status: DC | PRN
  Administered 2016-07-17: 500 mL

## 2016-07-17 MED ORDER — DEXAMETHASONE SODIUM PHOSPHATE 10 MG/ML IJ SOLN
INTRAMUSCULAR | Status: DC | PRN
  Administered 2016-07-17: 10 mg via INTRAVENOUS

## 2016-07-17 MED ORDER — PROMETHAZINE HCL 25 MG/ML IJ SOLN
6.2500 mg | INTRAMUSCULAR | Status: DC | PRN
  Filled 2016-07-17: qty 1

## 2016-07-17 MED ORDER — FENTANYL CITRATE (PF) 100 MCG/2ML IJ SOLN
25.0000 ug | INTRAMUSCULAR | Status: DC | PRN
  Administered 2016-07-17 (×2): 25 ug via INTRAVENOUS
  Filled 2016-07-17: qty 1

## 2016-07-17 MED ORDER — ROCURONIUM BROMIDE 100 MG/10ML IV SOLN
INTRAVENOUS | Status: DC | PRN
  Administered 2016-07-17: 10 mg via INTRAVENOUS
  Administered 2016-07-17: 70 mg via INTRAVENOUS

## 2016-07-17 MED ORDER — DEXAMETHASONE SODIUM PHOSPHATE 10 MG/ML IJ SOLN
INTRAMUSCULAR | Status: AC
  Filled 2016-07-17: qty 1

## 2016-07-17 MED ORDER — SUGAMMADEX SODIUM 200 MG/2ML IV SOLN
INTRAVENOUS | Status: AC
  Filled 2016-07-17: qty 4

## 2016-07-17 MED ORDER — PROPOFOL 500 MG/50ML IV EMUL
INTRAVENOUS | Status: AC
  Filled 2016-07-17: qty 100

## 2016-07-17 MED ORDER — PROPOFOL 10 MG/ML IV BOLUS
INTRAVENOUS | Status: DC | PRN
  Administered 2016-07-17: 300 mg via INTRAVENOUS
  Administered 2016-07-17: 25 mg via INTRAVENOUS

## 2016-07-17 MED ORDER — LACTATED RINGERS IV SOLN
INTRAVENOUS | Status: DC
  Administered 2016-07-17: 10:00:00 via INTRAVENOUS
  Filled 2016-07-17: qty 1000

## 2016-07-17 MED ORDER — IOHEXOL 300 MG/ML  SOLN
INTRAMUSCULAR | Status: DC | PRN
  Administered 2016-07-17: 3 mL

## 2016-07-17 MED ORDER — FLEET ENEMA 7-19 GM/118ML RE ENEM
1.0000 | ENEMA | Freq: Once | RECTAL | Status: AC
  Administered 2016-07-17: 1 via RECTAL
  Filled 2016-07-17: qty 1

## 2016-07-17 SURGICAL SUPPLY — 24 items
BAG URINE DRAINAGE (UROLOGICAL SUPPLIES) ×2 IMPLANT
BLADE CLIPPER SURG (BLADE) ×2 IMPLANT
CATH FOLEY 2WAY SLVR  5CC 16FR (CATHETERS) ×2
CATH FOLEY 2WAY SLVR 5CC 16FR (CATHETERS) ×2 IMPLANT
CATH ROBINSON RED A/P 20FR (CATHETERS) ×2 IMPLANT
CLOTH BEACON ORANGE TIMEOUT ST (SAFETY) ×2 IMPLANT
COVER BACK TABLE 60X90IN (DRAPES) ×2 IMPLANT
COVER MAYO STAND STRL (DRAPES) ×2 IMPLANT
DRSG TEGADERM 4X4.75 (GAUZE/BANDAGES/DRESSINGS) ×2 IMPLANT
DRSG TEGADERM 8X12 (GAUZE/BANDAGES/DRESSINGS) ×2 IMPLANT
GLOVE BIO SURGEON STRL SZ8 (GLOVE) ×4 IMPLANT
GLOVE ECLIPSE 8.0 STRL XLNG CF (GLOVE) ×10 IMPLANT
GOWN STRL REUS W/ TWL XL LVL3 (GOWN DISPOSABLE) ×1 IMPLANT
GOWN STRL REUS W/TWL XL LVL3 (GOWN DISPOSABLE) ×1
HOLDER FOLEY CATH W/STRAP (MISCELLANEOUS) ×2 IMPLANT
IV NS 1000ML (IV SOLUTION) ×1
IV NS 1000ML BAXH (IV SOLUTION) ×1 IMPLANT
KIT ROOM TURNOVER WOR (KITS) ×2 IMPLANT
PACK CYSTO (CUSTOM PROCEDURE TRAY) ×2 IMPLANT
SYRINGE 10CC LL (SYRINGE) ×2 IMPLANT
TUBE CONNECTING 12X1/4 (SUCTIONS) IMPLANT
UNDERPAD 30X30 INCONTINENT (UNDERPADS AND DIAPERS) ×4 IMPLANT
WATER STERILE IRR 500ML POUR (IV SOLUTION) ×4 IMPLANT
selectSeed I-125 ×190 IMPLANT

## 2016-07-17 NOTE — Progress Notes (Signed)
  Radiation Oncology         [phone] ________________________________  Name: Joe Parrish MRN: [id number]  Date: 07/17/2016  DOB: May 06, 1956       Prostate Seed Implant  CC:No primary care provider on file.  No ref. provider found  DIAGNOSIS: 60 y.o. gentleman with stage T1c adenocarcinoma of the prostate with a Gleason's score of 3+3 and a PSA of 9.6    ICD-9-CM ICD-10-CM   1. Prostate cancer ([place]) 185 C61 DG Chest 2 View     DG Chest 2 View     Discharge patient    PROCEDURE: Insertion of radioactive I-125 seeds into the prostate gland.  RADIATION DOSE: 145 Gy, definitive therapy.  TECHNIQUE: Joe Parrish was brought to the operating room with the urologist. He was placed in the dorsolithotomy position. He was catheterized and a rectal tube was inserted. The perineum was shaved, prepped and draped. The ultrasound probe was then introduced into the rectum to see the prostate gland.  TREATMENT DEVICE: A needle grid was attached to the ultrasound probe stand and anchor needles were placed.  3D PLANNING: The prostate was imaged in 3D using a sagittal sweep of the prostate probe. These images were transferred to the planning computer. There, the prostate, urethra and rectum were defined on each axial reconstructed image. Then, the software created an optimized 3D plan and a few seed positions were adjusted. The quality of the plan was reviewed using [facility] information for the target and the following two organs at risk:  Urethra and Rectum.  Then the accepted plan was uploaded to the seed Selectron afterloading unit.  PROSTATE VOLUME STUDY:  Using transrectal ultrasound the volume of the prostate was verified to be 89 cc.  SPECIAL TREATMENT PROCEDURE/SUPERVISION AND HANDLING: The Nucletron FIRST system was used to place the needles under sagittal guidance. A total of 23 needles were used to deposit 95 seeds in the prostate gland. The total seed activity was 74.005 mCi.  COMPLEX  SIMULATION: At the end of the procedure, an anterior radiograph of the pelvis was obtained to document seed positioning and count. Cystoscopy was performed to check the urethra and bladder.  MICRODOSIMETRY: At the end of the procedure, the patient was emitting 0.19 mR/hr at 1 meter. Accordingly, he was considered safe for hospital discharge.  PLAN: The patient will return to the radiation oncology clinic for post implant CT dosimetry in three weeks.   ________________________________  Sheral Apley Tammi Klippel, M.D.

## 2016-07-17 NOTE — Anesthesia Preprocedure Evaluation (Addendum)
Anesthesia Evaluation  Patient identified by MRN, date of birth, ID band Patient awake    Reviewed: Allergy & Precautions, H&P , NPO status , Patient's Chart, lab work & pertinent test results  History of Anesthesia Complications (+) PONV and history of anesthetic complications  Airway Mallampati: II  TM Distance: >3 FB Neck ROM: full    Dental no notable dental hx. (+) Teeth Intact,    Pulmonary neg pulmonary ROS,    Pulmonary exam normal breath sounds clear to auscultation       Cardiovascular negative cardio ROS Normal cardiovascular exam Rhythm:regular Rate:Normal     Neuro/Psych negative neurological ROS     GI/Hepatic negative GI ROS, Neg liver ROS,   Endo/Other  negative endocrine ROS  Renal/GU negative Renal ROS     Musculoskeletal   Abdominal   Peds  Hematology negative hematology ROS (+)   Anesthesia Other Findings   Reproductive/Obstetrics negative OB ROS                            Anesthesia Physical Anesthesia Plan  ASA: II  Anesthesia Plan: General   Post-op Pain Management:    Induction: Intravenous  Airway Management Planned: Oral ETT  Additional Equipment:   Intra-op Plan:   Post-operative Plan: Extubation in OR  Informed Consent: I have reviewed the patients History and Physical, chart, labs and discussed the procedure including the risks, benefits and alternatives for the proposed anesthesia with the patient or authorized representative who has indicated his/her understanding and acceptance.   Dental Advisory Given  Plan Discussed with: Anesthesiologist, CRNA and Surgeon  Anesthesia Plan Comments: (PONV: recommend preop scop patch + intraop decadron, zofran, IV hydration and propofol based anesthetic, with no volatile or N2O per ASA guidelines for patients with hx of severe PONV)        Anesthesia Quick Evaluation

## 2016-07-17 NOTE — Transfer of Care (Signed)
Immediate Anesthesia Transfer of Care Note  Patient: Joe Parrish  Procedure(s) Performed: Procedure(s) with comments: RADIOACTIVE SEED IMPLANT/BRACHYTHERAPY IMPLANT (N/A) - 95   seeds implanted;   no  seeds observed in bladder  Patient Location: PACU  Anesthesia Type:General  Level of Consciousness: awake, alert , oriented and patient cooperative  Airway & Oxygen Therapy: Patient Spontanous Breathing and Patient connected to nasal cannula oxygen  Post-op Assessment: Report given to RN and Post -op Vital signs reviewed and stable  Post vital signs: Reviewed and stable  Last Vitals:  Vitals:   07/17/16 1239 07/17/16 1245  BP: 136/89 (!) 146/83  Pulse: 73 74  Resp: 14 15  Temp: 36.6 C     Last Pain:  Vitals:   07/17/16 1239  TempSrc:   PainSc: 0-No pain      Patients Stated Pain Goal: 7 ([id number])  Complications: No apparent anesthesia complications

## 2016-07-17 NOTE — Discharge Instructions (Signed)
Post Anesthesia Home Care Instructions  Activity: Get plenty of rest for the remainder of the day. A responsible adult should stay with you for 24 hours following the procedure.  For the next 24 hours, DO NOT: -Drive a car -Paediatric nurse -Drink alcoholic beverages -Take any medication unless instructed by your physician -Make any legal decisions or sign important papers.  Meals: Start with liquid foods such as gelatin or soup. Progress to regular foods as tolerated. Avoid greasy, spicy, heavy foods. If nausea and/or vomiting occur, drink only clear liquids until the nausea and/or vomiting subsides. Call your physician if vomiting continues.  Special Instructions/Symptoms: Your throat may feel dry or sore from the anesthesia or the breathing tube placed in your throat during surgery. If this causes discomfort, gargle with warm salt water. The discomfort should disappear within 24 hours.  If you had a scopolamine patch placed behind your ear for the management of post- operative nausea and/or vomiting:  1. The medication in the patch is effective for 72 hours, after which it should be removed.  Wrap patch in a tissue and discard in the trash. Wash hands thoroughly with soap and water. 2. You may remove the patch earlier than 72 hours if you experience unpleasant side effects which may include dry mouth, dizziness or visual disturbances. 3. Avoid touching the patch. Wash your hands with soap and water after contact with the patch.   Radioactive Seed Implant Home Care Instructions   Activity:    Rest for the remainder of the day.  Do not drive or operate equipment today.  You may resume normal  activities in a few days as instructed by your physician, without risk of harmful radiation exposure to those around you, provided you follow the time and distance precautions on the Radiation Oncology Instruction Sheet.   Meals: Drink plenty of lipuids and eat light foods, such as gelatin or  soup this evening .  You may return to normal meal plan tomorrow.  Return To Work: You may return to work as instructed by Naval [employer].  Special Instruction:   If any seeds are found, use tweezers to pick up seeds and place in a glass container of any kind and bring to your physician's office.  Call your physician if any of these symptoms occur:   Persistent or heavy bleeding  Urine stream diminishes or stops completely after catheter is removed  Fever equal to or greater than 101 degrees F  Cloudy urine with a strong foul odor  Severe pain  You may feel some burning pain and/or hesitancy when you urinate after the catheter is removed.  These symptoms may increase over the next few weeks, but should diminish within forur to six weeks.  Applying moist heat to the lower abdomen or a hot tub bath may help relieve the pain.  If the discomfort becomes severe, please call your physician for additional medications.  Follow-up (Date of Return Visit to Physician):  Patient:_______________________________   @DATE @  Nurse:________________________________ @DATE @ [place]  Removal of catheter Remove the foley catheter after 24 hours ( day after the procedure).can be done easily by cutting the side port of the catheter, which will allow the balloon to deflate.  You will see 1-2 teaspoons of clear water as the balloon deflates and then the catheter can be slid out without difficulty.        Cut here  Antibiotics You may be given a prescription for an antibiotic to take  when you arrive home. If so, be sure to take every tablet in the bottle, even if you are feeling better before the prescription is finished. If you begin itching, notice a rash or start to swell on your trunk, arms, legs and/or throat, immediately stop taking the antibiotic and call your Urologist. Diet Resume your usual diet when you return home. To keep your bowels moving easily  and softly, drink prune, apple and cranberry juice at room temperature. You may also take a stool softener, such as Colace, which is available without prescription at local pharmacies. Daily activities ? No driving or heavy lifting for at least two days after the implant. ? No bike riding, horseback riding or riding lawn mowers for the first month after the implant. ? Any strenuous physical activity should be approved by your doctor before you resume it. Sexual relations You may resume sexual relations two weeks after the procedure. A condom should be used for the first two weeks. Your semen may be dark brown or black; this is normal and is related bleeding that may have occurred during the implant. Postoperative swelling Expect swelling and bruising of the scrotum and perineum (the area between the scrotum and anus). Both the swelling and the bruising should resolve in l or 2 weeks. Ice packs and over- the-counter medications such as Tylenol, Advil or Aleve may lessen your discomfort. Postoperative urination Most men experience burning on urination and/or urinary frequency. If this becomes bothersome, contact your Urologist.  Medication can be prescribed to relieve these problems.  It is normal to have some blood in your urine for a few days after the implant. Special instructions related to the seeds It is unlikely that you will pass an Iodine-125 seed in your urine. The seeds are silver in color and are about as large as a grain of rice. If you pass a seed, do not handle it with your fingers. Use a spoon to place it in an envelope or jar in place this in base occluded area such as the garage or basement for return to the radiation clinic at your convenience.  Contact your doctor for ? Temperature greater than 101 F ? Increasing pain ? Inability to urinate Follow-up  You should have follow up with your urologist and radiation oncologist about 3 weeks after the procedure. General information  regarding Iodine seeds ? Iodine-125 is a low energy radioactive material. It is not deeply penetrating and loses energy at short distances. Your prostate will absorb the radiation. Objects that are touched or used by the patient do not become radioactive. ? Body wastes (urine and stool) or body fluids (saliva, tears, semen or blood) are not radioactive. ? The Nuclear Regulatory Commission [facility]) has determined that no radiation precautions are needed for patients undergoing Iodine-125 seed implantation. The [facility] states that such patients do not present a risk to the people around them, including young children and pregnant women. However, in keeping with the general principle that radiation exposure should be kept as low reasonably possible, we suggest the following: ? Children and pets should not sit on the patient's lap for the first two (2) weeks after the implant. ? Pregnant (or possibly pregnant) women should avoid prolonged, close contact with the patient for the first two (2) weeks after the implant. ? A distance of three (3) feet is acceptable. ? At a distance of three (3) feet, there is no limit to the length of time anyone can be with the patient.

## 2016-07-17 NOTE — Anesthesia Postprocedure Evaluation (Signed)
Anesthesia Post Note  Patient: Xsavior Aupperle  Procedure(s) Performed: Procedure(s) (LRB): RADIOACTIVE SEED IMPLANT/BRACHYTHERAPY IMPLANT (N/A)  Patient location during evaluation: PACU Anesthesia Type: General Level of consciousness: awake and alert Pain management: pain level controlled Vital Signs Assessment: post-procedure vital signs reviewed and stable Respiratory status: spontaneous breathing, nonlabored ventilation, respiratory function stable and patient connected to nasal cannula oxygen Cardiovascular status: blood pressure returned to baseline and stable Postop Assessment: no signs of nausea or vomiting Anesthetic complications: no    Last Vitals:  Vitals:   07/17/16 1239 07/17/16 1245  BP: 136/89 (!) 146/83  Pulse: 73 74  Resp: 14 15  Temp: 36.6 C     Last Pain:  Vitals:   07/17/16 1239  TempSrc:   PainSc: 0-No pain                 Zenaida Deed

## 2016-07-17 NOTE — Anesthesia Procedure Notes (Addendum)
Procedure Name: Intubation Date/Time: 07/17/2016 10:57 AM Performed by: Wanita Chamberlain Pre-anesthesia Checklist: Patient identified, Timeout performed, Emergency Drugs available, Suction available and Patient being monitored Patient Re-evaluated:Patient Re-evaluated prior to inductionOxygen Delivery Method: Circle system utilized Preoxygenation: Pre-oxygenation with 100% oxygen Intubation Type: IV induction Ventilation: Mask ventilation without difficulty and Oral airway inserted - appropriate to patient size Laryngoscope Size: Mac and 4 Grade View: Grade I Tube type: Oral Tube size: 8.0 mm Number of attempts: 1 Airway Equipment and Method: Oral airway and Stylet Placement Confirmation: CO2 detector,  breath sounds checked- equal and bilateral and ETT inserted through vocal cords under direct vision Secured at: 23 cm Tube secured with: Tape Dental Injury: Teeth and Oropharynx as per pre-operative assessment

## 2016-07-17 NOTE — Op Note (Signed)
PATIENT:  Joe Parrish  PRE-OPERATIVE DIAGNOSIS:  Adenocarcinoma of the prostate  POST-OPERATIVE DIAGNOSIS:  Same  PROCEDURE:  Procedure(s): 1. I-125 radioactive seed implantation 2. Cystoscopy  SURGEON:  Surgeon(s): Claybon Jabs  Radiation oncologist: Dr. Tyler Pita  ANESTHESIA:  General  EBL:  Minimal  DRAINS: 30 French Foley catheter  INDICATION: [name] was found to have adenocarcinoma of the prostate by prostate biopsy and presents today for treatment with I-125 radioactive seed implantation.  Description of procedure: After informed consent the patient was brought to the major OR, placed on the table and administered general anesthesia. He was then moved to the modified lithotomy position with his perineum perpendicular to the floor. His perineum and genitalia were then sterilely prepped. An official timeout was then performed. A 16 French Foley catheter was then placed in the bladder and filled with dilute contrast, a rectal tube was placed in the rectum and the transrectal ultrasound probe was placed in the rectum and affixed to the stand. He was then sterilely draped.  Real time ultrasonography was used along with the seed planning software [employer] Prostate vs. 4.2.2.4. This was used to develop the seed plan including the number of needles as well as number of seeds required for complete and adequate coverage. Real-time ultrasonography was then used along with the previously developed plan and the Nucletron device to implant a total of 95 seeds using 23 needles. This proceeded without difficulty or complication.  A Foley catheter was then removed as well as the transrectal ultrasound probe and rectal probe. Flexible cystoscopy was then performed using the 17 French flexible scope which revealed a normal urethra throughout its length down to the sphincter which appeared intact. The prostatic urethra revealed bilobar hypertrophy but no evidence of obstruction, seeds,  spacers or lesions. The bladder was then entered and fully and systematically inspected. The ureteral orifices were noted to be of normal configuration and position. The mucosa revealed no evidence of tumors. There were also no stones identified within the bladder. I noted no seeds or spacers on the floor of the bladder and retroflexion of the scope revealed no seeds protruding from the base of the prostate.  The cystoscope was then removed and a new 53 French Foley catheter was then inserted and the balloon was filled with 10 cc of sterile water. This was connected to closed system drainage and the patient was awakened and taken to recovery room in stable and satisfactory condition. He tolerated procedure well and there were no intraoperative complications.

## 2016-07-18 ENCOUNTER — Encounter (HOSPITAL_BASED_OUTPATIENT_CLINIC_OR_DEPARTMENT_OTHER): Payer: Self-pay | Admitting: Urology

## 2016-08-08 ENCOUNTER — Other Ambulatory Visit: Payer: Self-pay | Admitting: *Deleted

## 2016-08-08 ENCOUNTER — Encounter: Payer: Self-pay | Admitting: Thoracic Surgery (Cardiothoracic Vascular Surgery)

## 2016-08-08 ENCOUNTER — Institutional Professional Consult (permissible substitution) (INDEPENDENT_AMBULATORY_CARE_PROVIDER_SITE_OTHER): Payer: BLUE CROSS/BLUE SHIELD | Admitting: Thoracic Surgery (Cardiothoracic Vascular Surgery)

## 2016-08-08 DIAGNOSIS — Q248 Other specified congenital malformations of heart: Secondary | ICD-10-CM

## 2016-08-08 DIAGNOSIS — J9859 Other diseases of mediastinum, not elsewhere classified: Secondary | ICD-10-CM

## 2016-08-08 DIAGNOSIS — C61 Malignant neoplasm of prostate: Secondary | ICD-10-CM | POA: Diagnosis not present

## 2016-08-08 NOTE — Patient Instructions (Signed)
Continue all previous medications without any changes at this time  

## 2016-08-08 NOTE — Progress Notes (Signed)
[address]       [place] [address]             [phone]     CARDIOTHORACIC SURGERY CONSULTATION REPORT  Referring Provider is Kathie Rhodes, MD PCP is Greig Right, MD  Chief Complaint  Patient presents with  . Mediastinal Mass    Surgical eval on 6 cm cystic mass, Chest CT 06/14/16     HPI:  Patient is a 60 year old moderately obese but otherwise healthy white male who recently underwent routine preoperative chest x-ray prior to surgery for radioactive seed implantation for treatment of prostate cancer. Follow-up chest CT scan and MRI scan revealed the presence of a 4 x 6 cm benign-appearing cystic mass in the anterior mediastinum. The patient underwent uncomplicated radioactive seed implantation for treatment of his prostate cancer by Dr. Karsten Ro on 07/17/2016. He has been referred for elective thoracic surgical consultation.  The patient is married and lives with his wife in [place]. He has been retired for approximately 4 years, having previously worked for [employer]. 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 He reports no significant physical limitations up until recently, although he admits that for the past few months he has had decreased energy which he blames on his Lupron injections. He denies any history of exertional chest pain, chest tightness, or shortness of breath. He denies any other types of chest pain including any sort of pain with deep inspiration or cough. He denies any history of fevers, chills, productive cough, or persistent dry cough. He has no difficulty swallowing. Appetite is been stable. He has not been gaining or losing weight.   Past Medical History:  Diagnosis Date  . Family history of adverse reaction to anesthesia    all family PONV  . Hyperplasia of prostate with lower urinary tract symptoms (LUTS)   . Mediastinal mass 06/01/2016  . PONV  (postoperative nausea and vomiting)    severe  . Prostate cancer [facility]) urologist-  dr ottelin/  oncologist-  dr Tammi Klippel   T1c,  PSA 9.6,  Gleason 3+3,  vol 62cc  . Wears glasses     Past Surgical History:  Procedure Laterality Date  . KNEE ARTHROSCOPY Right 2004  . LAPAROSCOPIC CHOLECYSTECTOMY  1997  . ORIF LEFT ANKLE FX  2013   retained hardware  . RADIOACTIVE SEED IMPLANT N/A 07/17/2016   Procedure: RADIOACTIVE SEED IMPLANT/BRACHYTHERAPY IMPLANT;  Surgeon: Kathie Rhodes, MD;  Location: [facility];  Service: Urology;  Laterality: N/A;  95   seeds implanted;   no  seeds observed in bladder    Family History  Problem Relation Age of Onset  . Cancer Mother     colon received radiation therapy  . Cancer Other     neice receiving radiation therapy    Social History   Social History  . Marital status: Married    Spouse name: N/A  . Number of children: N/A  . Years of education: N/A   Occupational History  . Not on file.   Social History Main Topics  . Smoking status: Never Smoker  . Smokeless tobacco: Never Used  . Alcohol use No  . Drug use: No  . Sexual activity: Yes   Other Topics Concern  . Not on file   Social History Narrative  . No narrative on file    Current Outpatient Prescriptions  Medication Sig Dispense Refill  .  tamsulosin (FLOMAX) 0.4 MG CAPS capsule Take 0.4 mg by mouth daily after supper.      No current facility-administered medications for this visit.     Allergies  Allergen Reactions  . Codeine Nausea And Vomiting    SEVERE      Review of Systems:   General:  normal appetite, decreased energy, no weight gain, no weight loss, no fever  Cardiac:  no chest pain with exertion, no chest pain at rest, no SOB with exertion, no resting SOB, no PND, no orthopnea, no palpitations, no arrhythmia, no atrial fibrillation, no LE edema, no dizzy spells, no syncope  Respiratory:  no shortness of breath, no home oxygen, no productive  cough, no dry cough, no bronchitis, no wheezing, no hemoptysis, no asthma, no pain with inspiration or cough, no sleep apnea, no CPAP at night  GI:   no difficulty swallowing, no reflux, no frequent heartburn, no hiatal hernia, no abdominal pain, no constipation, no diarrhea, no hematochezia, no hematemesis, no melena  GU:   no dysuria,  + frequency, no urinary tract infection, no hematuria, + enlarged prostate, no kidney stones, no kidney disease  Vascular:  no pain suggestive of claudication, no pain in feet, no leg cramps, no varicose veins, no DVT, no non-healing foot ulcer  Neuro:   no stroke, no TIA's, no seizures, no headaches, no temporary blindness one eye,  no slurred speech, no peripheral neuropathy, no chronic pain, no instability of gait, no memory/cognitive dysfunction  Musculoskeletal: no arthritis, no joint swelling, no myalgias, no difficulty walking, normal mobility   Skin:   no rash, no itching, no skin infections, no pressure sores or ulcerations  Psych:   no anxiety, no depression, no nervousness, no unusual recent stress  Eyes:   no blurry vision, no floaters, no recent vision changes, + wears glasses or contacts  ENT:   no hearing loss, no loose or painful teeth, no dentures, last saw dentist within the past 6 months  Hematologic:  no easy bruising, no abnormal bleeding, no clotting disorder, no frequent epistaxis  Endocrine:  no diabetes, does not check CBG's at home     Physical Exam:   BP (!) 143/90 (BP Location: Left Arm, Patient Position: Sitting, Cuff Size: Large)   Pulse 90   Resp 20   Ht 6\' 3"  (1.905 m)   Wt 300 lb (136.1 kg)   BMI 37.50 kg/m   General:  Obese, o/w  well-appearing  HEENT:  Unremarkable   Neck:   no JVD, no bruits, no adenopathy   Chest:   clear to auscultation, symmetrical breath sounds, no wheezes, no rhonchi   CV:   RRR, no  murmur   Abdomen:  soft, non-tender, no masses   Extremities:  warm, well-perfused, pulses palpable, no LE  edema  Rectal/GU  Deferred  Neuro:   Grossly non-focal and symmetrical throughout  Skin:   Clean and dry, no rashes, no breakdown   Diagnostic Tests:  MRI CHEST WITHOUT AND WITH CONTRAST  TECHNIQUE: Multiplanar multi sequence imaging of the chest was performed to evaluate the mediastinum both before and after administration of contrast.  CONTRAST:  38mL MULTIHANCE GADOBENATE DIMEGLUMINE 529 MG/ML IV SOLN  COMPARISON:  Chest CT 06/14/2016  FINDINGS: The chest wall is unremarkable. No chest wall masses, supraclavicular or axillary lymphadenopathy.  The heart is normal in size. No pericardial effusion. The aorta is normal in caliber. No dissection. The major branch vessels are patent. The pulmonary arteries appear normal.  No mediastinal or hilar mass or lymphadenopathy. The esophagus is grossly normal.  As demonstrated on the chest CT there is a 6.3 x 4.8 x 3.8 cm pericardial mass on the right side. This is adjacent to the ascending aorta and the SVC. It appears to be located within the pericardium. It has heterogeneous T1 and T2 signal intensity but is not bright on T2 weighted sequences. It does demonstrate moderate irregular contrast enhancement. This is not a pericardial cyst or pericardial lipoma. Differential considerations would include pericardial paraganglioma, pericardial hemangioma, pericardial lymphangioma or inflammatory pseudotumor. It is diffusion negative and I think it is unlikely a sarcoma, mesothelioma or lymphoma. There are no CT or MR findings to suggest direct invasion of the heart or great vessels. Lesion is fairly well circumscribed and appears to be in capsulated.  IMPRESSION: 1. 6 cm pericardial lesion, most likely a benign neoplasm with differential diagnosis as discussed above. Resection or biopsy may be indicated. 2. No mediastinal or hilar lymphadenopathy. 3. No worrisome lung lesions.   Electronically Signed   By: Marijo Sanes M.D.   On: 07/13/2016 08:43   Impression:  I have personally reviewed the patient's recent chest CT scan and MRI. He has a moderately large (6 x 5 x 4 cm) anterior mediastinal mass that has benign-appearing characteristics on CT scan and MRI that is located immediately anterior to the superior vena cava near the junction with the right atrium.  The mass is not a simple fluid-filled cyst and does not appear consistent with a simple pericardial cyst. Moreover, the location would be very atypical. The mass appears smooth and well-circumscribed, suggesting benign soft tissue etiology. In my opinion thymoma would likely be the most likely diagnosis, although the location is slightly atypical and no other significant thymic tissue is noted on scans. Ectopic thyroid tissue is a possibility, as are a variety of other soft tissue tumors. Radiographic appearance are not suggestive of lymphoma or germ cell tumor. Options include surgical resection versus observation versus CT-guided biopsy. Under the circumstances I would favor proceeding directly to surgery for definitive diagnosis and treatment.   Plan:  I discussed matters at length with the patient and his wife in the office today. Differential diagnosis for this radiographic finding has been discussed. They understand that although all findings suggest an underlying benign etiology, the possibility of malignancy can only be ruled out with surgical resection. Options for surgical approach of been discussed and all their questions have been addressed. The patient desires to contemplate matters further before making a final decision, but in principle he is agreeable with proceeding with surgery later during this calendar year. At this time he wants to wait at least a month or 2 to further recover from his recent prostate surgery in 10 to practical matters at home. The patient desired to schedule a follow-up appointment in approximately 6 weeks. We will  obtain a routine chest radiographic that time to make sure there has been no sign of dramatic change in the size of this mass. Presuming this is not the case we will make a definitive decision about surgery at that time. All of their questions have been addressed.   I spent in excess of 90 minutes during the conduct of this office consultation and >50% of this time involved direct face-to-face encounter with the patient for counseling and/or coordination of their care.   Valentina Gu. Roxy Manns, MD 08/08/2016 5:13 PM

## 2016-08-10 ENCOUNTER — Ambulatory Visit: Payer: BLUE CROSS/BLUE SHIELD | Admitting: Radiation Oncology

## 2016-08-10 ENCOUNTER — Ambulatory Visit: Payer: Self-pay | Admitting: Radiation Oncology

## 2016-08-17 ENCOUNTER — Ambulatory Visit
Admission: RE | Admit: 2016-08-17 | Discharge: 2016-08-17 | Disposition: A | Discharge status: Home / Self Care | Payer: BLUE CROSS/BLUE SHIELD | Source: Ambulatory Visit | Attending: Radiation Oncology | Admitting: Radiation Oncology

## 2016-08-17 ENCOUNTER — Encounter: Payer: Self-pay | Admitting: Radiation Oncology

## 2016-08-17 VITALS — BP 133/91 | HR 74 | Resp 16 | Wt 304.5 lb

## 2016-08-17 DIAGNOSIS — C61 Malignant neoplasm of prostate: Secondary | ICD-10-CM | POA: Diagnosis not present

## 2016-08-17 DIAGNOSIS — Z510 Encounter for antineoplastic radiation therapy: Secondary | ICD-10-CM | POA: Insufficient documentation

## 2016-08-17 NOTE — Progress Notes (Signed)
Patient and wife return today for post seed follow up with Dr. Tammi Klippel. Pre seed IPSS was 10 but, post seed IPSS is 20. Patient is now approximately 4 weeks s/p implant. Primary complaints associated with post seed IPSS are weak stream, straining with urination, and nocturia x 5. Reports taking flomax and hold fluids hours before bed to manage nocturia. Denies dysuria or hematuria. Reports Flomax had help to stop occasional leakage prior to implantation however, since implantation occasional leakage has returned. Reports taking Metamucil and Fiber to avoid constipation. Reports his last Lupron injection was in March. Reports hot flashes have almost resolved completely. Questions when he can resume sexual activity. Reports erectile dysfunction is a new issue of concern. Scheduled to follow up with urologist in December for repeat PSA.   BP (!) 133/91 (BP Location: Left Arm, Patient Position: Sitting, Cuff Size: Large)   Pulse 74   Resp 16   Wt (!) 304 lb 8 oz (138.1 kg)   SpO2 100%   BMI 38.06 kg/m  Wt Readings from Last 3 Encounters:  08/17/16 (!) 304 lb 8 oz (138.1 kg)  08/08/16 300 lb (136.1 kg)  07/17/16 298 lb 8 oz (135.4 kg)

## 2016-08-17 NOTE — Progress Notes (Signed)
  Radiation Oncology         [phone] ________________________________  Name: Danh Poyser MRN: [id number]  Date: 08/17/2016  DOB: 10/06/1956  COMPLEX SIMULATION NOTE  NARRATIVE:  The patient was brought to the [facility] today following prostate seed implantation approximately one month ago.  Identity was confirmed.  All relevant records and images related to the planned course of therapy were reviewed.  Then, the patient was set-up supine.  CT images were obtained.  The CT images were loaded into the planning software.  Then the prostate and rectum were contoured.  Treatment planning then occurred.  The implanted iodine 125 seeds were identified by the physics staff for projection of radiation distribution  I have requested : 3D Simulation  I have requested a DVH of the following structures: Prostate and rectum.    ________________________________  Sheral Apley Tammi Klippel, M.D.  This document serves as a record of services personally performed by Tyler Pita, MD. It was created on his behalf by Arlyce Harman, a trained medical scribe. The creation of this record is based on the scribe's personal observations and the provider's statements to them. This document has been checked and approved by the attending provider.

## 2016-09-06 DIAGNOSIS — Z510 Encounter for antineoplastic radiation therapy: Secondary | ICD-10-CM | POA: Diagnosis not present

## 2016-09-06 DIAGNOSIS — C61 Malignant neoplasm of prostate: Secondary | ICD-10-CM | POA: Diagnosis not present

## 2016-09-10 ENCOUNTER — Encounter: Payer: Self-pay | Admitting: Radiation Oncology

## 2016-09-10 NOTE — Progress Notes (Signed)
  Radiation Oncology         [phone] ________________________________  Name: Garth Walczak MRN: [id number]  Date: 09/10/2016  DOB: 28-Jun-1956  3D Planning Note   Prostate Brachytherapy Post-Implant Dosimetry  Diagnosis: 60 y.o. gentleman with stage T1c adenocarcinoma of the prostate with a Gleason's score of 3+3 and a PSA of 9.6.  Narrative: On a previous date, Milagro Trayer returned following prostate seed implantation for post implant planning. He underwent CT scan complex simulation to delineate the three-dimensional structures of the pelvis and demonstrate the radiation distribution.  Since that time, the seed localization, and complex isodose planning with dose volume histograms have now been completed.  Results:   Prostate Coverage - The dose of radiation delivered to the 90% or more of the prostate gland (D90) was 107.11% of the prescription dose. This exceeds our goal of greater than 90%. Rectal Sparing - The volume of rectal tissue receiving the prescription dose or higher was 0.02 cc. This falls under our thresholds tolerance of 1.0 cc.  Impression: The prostate seed implant appears to show adequate target coverage and appropriate rectal sparing.  Plan:  The patient will continue to follow with urology for ongoing PSA determinations. I would anticipate a high likelihood for local tumor control with minimal risk for rectal morbidity.  ________________________________  Sheral Apley Tammi Klippel, M.D.

## 2016-09-11 ENCOUNTER — Ambulatory Visit (HOSPITAL_COMMUNITY)
Admission: RE | Admit: 2016-09-11 | Discharge: 2016-09-11 | Disposition: A | Discharge status: Home / Self Care | Payer: BLUE CROSS/BLUE SHIELD | Source: Ambulatory Visit | Attending: Thoracic Surgery (Cardiothoracic Vascular Surgery) | Admitting: Thoracic Surgery (Cardiothoracic Vascular Surgery)

## 2016-09-11 DIAGNOSIS — I501 Left ventricular failure: Secondary | ICD-10-CM | POA: Insufficient documentation

## 2016-09-11 DIAGNOSIS — I340 Nonrheumatic mitral (valve) insufficiency: Secondary | ICD-10-CM | POA: Insufficient documentation

## 2016-09-11 DIAGNOSIS — Q248 Other specified congenital malformations of heart: Secondary | ICD-10-CM | POA: Insufficient documentation

## 2016-09-11 LAB — ECHOCARDIOGRAM COMPLETE
CHL CUP DOP CALC LVOT VTI: 29.8 cm
CHL CUP MV DEC (S): 387
CHL CUP STROKE VOLUME: 76 mL
E decel time: 387 msec
EERAT: 7.95
FS: 41 % (ref 28–44)
IV/PV OW: 1.07
LA diam end sys: 40 mm
LA vol A4C: 43.5 ml
LADIAMINDEX: 1.45 cm/m2
LASIZE: 40 mm
LAVOL: 50.6 mL
LAVOLIN: 18.4 mL/m2
LV E/e' medial: 7.95
LV dias vol index: 40 mL/m2
LV dias vol: 109 mL (ref 62–150)
LV e' LATERAL: 8.49 cm/s
LV sys vol index: 12 mL/m2
LV sys vol: 33 mL (ref 21–61)
LVEEAVG: 7.95
LVOT area: 4.52 cm2
LVOT diameter: 24 mm
LVOT peak grad rest: 9 mmHg
LVOTPV: 146 cm/s
LVOTSV: 135 mL
MVPKAVEL: 82.9 m/s
MVPKEVEL: 67.5 m/s
PW: 9.08 mm — AB (ref 0.6–1.1)
RV LATERAL S' VELOCITY: 11.5 cm/s
RV TAPSE: 23.6 mm
Simpson's disk: 70
TDI e' lateral: 8.49
TDI e' medial: 9.46

## 2016-09-11 NOTE — Progress Notes (Signed)
Echocardiogram 2D Echocardiogram has been performed.  Joe Parrish 09/11/2016, 11:39 AM

## 2016-09-14 ENCOUNTER — Other Ambulatory Visit: Payer: Self-pay | Admitting: Thoracic Surgery (Cardiothoracic Vascular Surgery)

## 2016-09-14 DIAGNOSIS — J9859 Other diseases of mediastinum, not elsewhere classified: Secondary | ICD-10-CM

## 2016-09-18 ENCOUNTER — Encounter: Payer: Self-pay | Admitting: Thoracic Surgery (Cardiothoracic Vascular Surgery)

## 2016-09-18 ENCOUNTER — Ambulatory Visit
Admission: RE | Admit: 2016-09-18 | Discharge: 2016-09-18 | Disposition: A | Discharge status: Home / Self Care | Payer: BLUE CROSS/BLUE SHIELD | Source: Ambulatory Visit | Attending: Thoracic Surgery (Cardiothoracic Vascular Surgery) | Admitting: Thoracic Surgery (Cardiothoracic Vascular Surgery)

## 2016-09-18 ENCOUNTER — Ambulatory Visit (INDEPENDENT_AMBULATORY_CARE_PROVIDER_SITE_OTHER): Payer: BLUE CROSS/BLUE SHIELD | Admitting: Thoracic Surgery (Cardiothoracic Vascular Surgery)

## 2016-09-18 ENCOUNTER — Other Ambulatory Visit: Payer: Self-pay | Admitting: *Deleted

## 2016-09-18 VITALS — BP 144/83 | HR 82 | Resp 16 | Ht 75.0 in | Wt 300.0 lb

## 2016-09-18 DIAGNOSIS — R609 Edema, unspecified: Secondary | ICD-10-CM | POA: Diagnosis not present

## 2016-09-18 DIAGNOSIS — R112 Nausea with vomiting, unspecified: Secondary | ICD-10-CM | POA: Diagnosis not present

## 2016-09-18 DIAGNOSIS — J9859 Other diseases of mediastinum, not elsewhere classified: Secondary | ICD-10-CM | POA: Diagnosis not present

## 2016-09-18 DIAGNOSIS — Z9889 Other specified postprocedural states: Secondary | ICD-10-CM

## 2016-09-18 DIAGNOSIS — Z23 Encounter for immunization: Secondary | ICD-10-CM | POA: Diagnosis not present

## 2016-09-18 NOTE — Patient Instructions (Signed)
Continue all previous medications without any changes at this time  Consult with your Urologist and primary care physician regarding problems with fluid retention since you received Lupron injection

## 2016-09-18 NOTE — Progress Notes (Signed)
[address]       [place], [address]             [phone]     CARDIOTHORACIC SURGERY OFFICE NOTE  Referring Provider is Kathie Rhodes, MD PCP is [facility], MD   HPI:  Patient returns to the office today for follow-up of anterior mediastinal mass originally discovered on routine preoperative chest radiograph. Subsequent CT scan and MRI confirmed the presence of a moderately large (6 x 5 x 4 cm) anterior mediastinal mass with benign appearing characteristics.  He was originally seen in consultation on 08/08/2016.  At that time he did not wish to proceed with surgery for personal reasons but he returns at this time with hopes to proceed sometime later this year. The patient reports no new problems or complaints over the last couple of months. He states that he still has significant fluid retention that originally began after he underwent Lupron injection early this year. He otherwise feels well. He specifically denies any history of chest pain or chest tightness either with activity or at rest. He denies any fevers, chills, or productive cough.   Current Outpatient Prescriptions  Medication Sig Dispense Refill  . tamsulosin (FLOMAX) 0.4 MG CAPS capsule Take 0.4 mg by mouth daily after supper.      No current facility-administered medications for this visit.       Physical Exam:   BP (!) 144/83   Pulse 82   Resp 16   Ht 6\' 3"  (1.905 m)   Wt 300 lb (136.1 kg)   SpO2 98% Comment: ON RA  BMI 37.50 kg/m   General:  Obese but well appearing  Chest:   Clear to auscultation  CV:   Regular rate and rhythm  Incisions:  n/a  Abdomen:  Soft and nontender  Extremities:  Warm and well-perfused  Diagnostic Tests:  Transthoracic Echocardiography  Patient:    Joe Parrish, Joe Parrish MR #:       [id number] Study Date: 09/11/2016 Gender:     M Age:        60 Height:     190.5 cm Weight:     138.1 kg BSA:        2.75 m^2 Pt. Status: Room:   ATTENDING     Darylene Price, M.D.  ORDERING     Darylene Price, M.D.  REFERRING    Darylene Price, M.D.  PERFORMING   [facility], Outpatient  SONOGRAPHER  Madelin Rear, RDCS  cc:  ------------------------------------------------------------------- LV EF: 65% -   70%  ------------------------------------------------------------------- Indications:      V728.1 Pre-op evaluation.  ------------------------------------------------------------------- History:   PMH:  No prior cardiac history.  ------------------------------------------------------------------- Study Conclusions  - Left ventricle: The cavity size was normal. Systolic function was   vigorous. The estimated ejection fraction was in the range of 65%   to 70%. Wall motion was normal; there were no regional wall   motion abnormalities. Doppler parameters are consistent with   abnormal left ventricular relaxation (grade 1 diastolic   dysfunction). - Aortic valve: Transvalvular velocity was within the normal range.   There was no stenosis. There was no regurgitation. - Mitral valve: Transvalvular velocity was within the normal range.   There was no evidence for stenosis. There was trivial   regurgitation. - Right ventricle: The cavity size was normal. Wall thickness was   normal. Systolic function was normal. - Atrial septum: No defect or patent foramen ovale was identified   by color  flow Doppler. - Tricuspid valve: There was no regurgitation. - Global longitudinal strain -17.4%.  ------------------------------------------------------------------- Study data:  No prior study was available for comparison.  Study status:  Routine.  Procedure:  The patient reported no pain pre or post test. Transthoracic echocardiography. Image quality was adequate.  Study completion:  There were no complications. Transthoracic echocardiography.  M-mode, complete 2D, spectral Doppler, and color Doppler.  Birthdate:  Patient birthdate: [birth year].  Age:   Patient is 60 yr old.  Sex:  Gender: male. BMI: 38.1 kg/m^2.  Blood pressure:     120/80  Patient status: Outpatient.  Study date:  Study date: 09/11/2016. Study time: 10:58 AM.  Location:  [facility] laboratory.  -------------------------------------------------------------------  ------------------------------------------------------------------- Left ventricle:  The cavity size was normal. Systolic function was vigorous. The estimated ejection fraction was in the range of 65% to 70%. Wall motion was normal; there were no regional wall motion abnormalities. Doppler parameters are consistent with abnormal left ventricular relaxation (grade 1 diastolic dysfunction).  ------------------------------------------------------------------- Aortic valve:   Trileaflet; normal thickness leaflets. Mobility was not restricted.  Doppler:  Transvalvular velocity was within the normal range. There was no stenosis. There was no regurgitation.   ------------------------------------------------------------------- Aorta:  Aortic root: The aortic root was normal in size.  ------------------------------------------------------------------- Mitral valve:   Structurally normal valve.   Mobility was not restricted.  Doppler:  Transvalvular velocity was within the normal range. There was no evidence for stenosis. There was trivial regurgitation.  ------------------------------------------------------------------- Left atrium:  The atrium was normal in size.  ------------------------------------------------------------------- Atrial septum:  No defect or patent foramen ovale was identified by color flow Doppler.  ------------------------------------------------------------------- Right ventricle:  The cavity size was normal. Wall thickness was normal. Systolic function was normal.  ------------------------------------------------------------------- Pulmonic valve:    Structurally normal valve.    Cusp separation was normal.  Doppler:  Transvalvular velocity was within the normal range. There was no evidence for stenosis. There was no regurgitation.  ------------------------------------------------------------------- Tricuspid valve:   Structurally normal valve.    Doppler: Transvalvular velocity was within the normal range. There was no regurgitation.  ------------------------------------------------------------------- Pulmonary artery:   The main pulmonary artery was normal-sized.  ------------------------------------------------------------------- Right atrium:  The atrium was normal in size.  ------------------------------------------------------------------- Pericardium:  There was no pericardial effusion.  ------------------------------------------------------------------- Systemic veins: Inferior vena cava: Not visualized.  ------------------------------------------------------------------- Measurements   Left ventricle                          Value        Reference  LV ID, ED, PLAX chordal          (H)    52.9  mm     43 - 52  LV ID, ES, PLAX chordal                 31.3  mm     23 - 38  LV fx shortening, PLAX chordal          41    %      >=29  LV PW thickness, ED                     9.08  mm     ---------  IVS/LV PW ratio, ED                     1.07         <=1.3  Stroke volume, 2D  135   ml     ---------  Stroke volume/bsa, 2D                   49    ml/m^2 ---------  LV ejection fraction, 1-p A4C           72    %      ---------  LV end-diastolic volume, 2-p            109   ml     ---------  LV end-systolic volume, 2-p             33    ml     ---------  LV ejection fraction, 2-p               70    %      ---------  Stroke volume, 2-p                      76    ml     ---------  LV end-diastolic volume/bsa, 2-p        40    ml/m^2 ---------  LV end-systolic volume/bsa, 2-p         12    ml/m^2 ---------  Stroke volume/bsa,  2-p                  27.8  ml/m^2 ---------  LV e&', lateral                          8.49  cm/s   ---------  LV E/e&', lateral                        7.95         ---------  LV e&', medial                           9.46  cm/s   ---------  LV E/e&', medial                         7.14         ---------  LV e&', average                          8.98  cm/s   ---------  LV E/e&', average                        7.52         ---------  Longitudinal strain, TDI                17    %      ---------    Ventricular septum                      Value        Reference  IVS thickness, ED                       9.76  mm     ---------    LVOT  Value        Reference  LVOT ID, S                              24    mm     ---------  LVOT area                               4.52  cm^2   ---------  LVOT peak velocity, S                   146   cm/s   ---------  LVOT mean velocity, S                   84.8  cm/s   ---------  LVOT VTI, S                             29.8  cm     ---------  LVOT peak gradient, S                   9     mm Hg  ---------    Aorta                                   Value        Reference  Aortic root ID, ED                      33    mm     ---------    Left atrium                             Value        Reference  LA ID, A-P, ES                          40    mm     ---------  LA ID/bsa, A-P                          1.45  cm/m^2 <=2.2  LA volume, S                            50.6  ml     ---------  LA volume/bsa, S                        18.4  ml/m^2 ---------  LA volume, ES, 1-p A4C                  43.5  ml     ---------  LA volume/bsa, ES, 1-p A4C              15.8  ml/m^2 ---------  LA volume, ES, 1-p A2C                  51    ml     ---------  LA volume/bsa, ES, 1-p A2C              18.5  ml/m^2 ---------  Mitral valve                            Value        Reference  Mitral E-wave peak velocity             67.5  cm/s   ---------   Mitral A-wave peak velocity             82.9  cm/s   ---------  Mitral deceleration time         (H)    387   ms     150 - 230  Mitral E/A ratio, peak                  0.8          ---------    Systemic veins                          Value        Reference  Estimated CVP                           3     mm Hg  ---------    Right ventricle                         Value        Reference  TAPSE                                   23.6  mm     ---------  RV s&', lateral, S                       11.5  cm/s   ---------  Legend: (L)  and  (H)  mark values outside specified reference range.  ------------------------------------------------------------------- Prepared and Electronically Authenticated by  Skeet Latch, MD 2017-10-09T15:59:18    CHEST  2 VIEW  COMPARISON:  Chest radiograph 06/01/2016.  FINDINGS: Stable cardiac and mediastinal contours. Grossly unchanged contour abnormality along the right aspect of the mediastinum. No consolidative pulmonary opacities. No pleural effusion or pneumothorax. Mid thoracic spine degenerative changes. Cholecystectomy clips.  IMPRESSION: Unchanged contour abnormality along the right aspect mediastinum, most compatible with mediastinal mass, further evaluated on MRI 07/12/2016.   Electronically Signed   By: Lovey Newcomer M.D.   On: 09/18/2016 09:32    Impression:  Patient has a moderately large (6 x 5 x 4 cm) anterior mediastinal mass that has benign-appearing characteristics on CT scan and MRI that is located immediately anterior to the superior vena cava near the junction with the right atrium.  The mass is not a simple fluid-filled cyst and does not appear consistent with a simple pericardial cyst. Moreover, the location would be very atypical. The mass appears smooth and well-circumscribed, suggesting benign soft tissue etiology. In my opinion thymoma would likely be the most likely diagnosis, although the location is slightly  atypical and no other significant thymic tissue is noted on scans. Ectopic thyroid tissue is a possibility, as are a variety of other soft tissue tumors. Radiographic appearance are not suggestive of lymphoma or germ cell tumor. Options include surgical resection versus observation versus CT-guided biopsy. Under the circumstances I would favor proceeding directly to  surgery for definitive diagnosis and treatment.  Follow-up chest x-ray performed today demonstrates no significant interval change in radiographic appearance of the chest and in particular nothing to suggest further growth of this mass. The patient underwent routine transthoracic echocardiogram confirming the presence of normal left ventricular size and systolic function.   Plan:  I have again reviewed the indications, risks, and potential benefits of surgical resection of the patient's anterior mediastinal mass with the patient and his wife in the office today. Alternative surgical approaches and discussed including a comparison between and conventional partial or complete median sternotomy and video assisted thoracoscopy for minimally invasive approach.  The patient understands that there is a significant chance that we may need to convert to a conventional median sternotomy if safe resection of the mass is not feasible via a mini thoracotomy approach.  The patient understands and accepts all potential associated risks of surgery including but not limited to risk of death, stroke, myocardial infarction, congestive heart failure, respiratory failure, renal failure, bleeding requiring blood transfusion and/or reexploration, arrhythmia, pneumonia, pleural effusion, wound infection, pulmonary embolus or other thromboembolic complication, injury to the phrenic nerve, chronic pain or other delayed complications.  All questions answered.  We plan to proceed with surgery on Friday, 10/06/2016.   I spent in excess of 30 minutes during the conduct of  this office consultation and >50% of this time involved direct face-to-face encounter with the patient for counseling and/or coordination of their care.   Valentina Gu. Roxy Manns, MD 09/18/2016 10:15 AM

## 2016-09-19 DIAGNOSIS — R609 Edema, unspecified: Secondary | ICD-10-CM | POA: Diagnosis not present

## 2016-10-01 NOTE — H&P (Signed)
[address]       ,[place] [address]             [phone]          CARDIOTHORACIC SURGERY HISTORY AND PHYSICAL EXAM  Referring Provider is Kathie Rhodes, MD PCP is Greig Right, MD      Chief Complaint  Patient presents with  . Mediastinal Mass    Surgical eval on 6 cm cystic mass, Chest CT 06/14/16     HPI:  Patient is a 60 year old moderately obese but otherwise healthy white male who recently underwent routine preoperative chest x-ray prior to surgery for radioactive seed implantation for treatment of prostate cancer. Follow-up chest CT scan and MRI scan revealed the presence of a 4 x 6 cm benign-appearing cystic mass in the anterior mediastinum. The patient underwent uncomplicated radioactive seed implantation for treatment of his prostate cancer by Dr. Karsten Ro on 07/17/2016. He has been referred for elective thoracic surgical consultation.  The patient is married and lives with his wife in [place]. He has been retired for approximately 4 years, having previously worked for [employer]. 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 He reports no significant physical limitations up until recently, although he admits that for the past few months he has had decreased energy which he blames on his Lupron injections. He denies any history of exertional chest pain, chest tightness, or shortness of breath. He denies any other types of chest pain including any sort of pain with deep inspiration or cough. He denies any history of fevers, chills, productive cough, or persistent dry cough. He has no difficulty swallowing. Appetite is been stable. He has not been gaining or losing weight.  Patient returns to the office today for follow-up of anterior mediastinal mass originally discovered on routine preoperative chest radiograph. Subsequent CT scan and MRI confirmed the presence of a moderately  large (6 x 5 x 4 cm) anterior mediastinal mass with benign appearing characteristics.  He was originally seen in consultation on 08/08/2016.  At that time he did not wish to proceed with surgery for personal reasons but he returns at this time with hopes to proceed sometime later this year. The patient reports no new problems or complaints over the last couple of months. He states that he still has significant fluid retention that originally began after he underwent Lupron injection early this year. He otherwise feels well. He specifically denies any history of chest pain or chest tightness either with activity or at rest. He denies any fevers, chills, or productive cough.   Past Medical History:  Diagnosis Date  . Family history of adverse reaction to anesthesia    all family PONV  . Hyperplasia of prostate with lower urinary tract symptoms (LUTS)   . Mediastinal mass 06/01/2016  . PONV (postoperative nausea and vomiting)    severe  . Prostate cancer [facility]) urologist-  dr ottelin/  oncologist-  dr Tammi Klippel   T1c,  PSA 9.6,  Gleason 3+3,  vol 62cc  . Wears glasses     Past Surgical History:  Procedure Laterality Date  . KNEE ARTHROSCOPY Right 2004  . LAPAROSCOPIC CHOLECYSTECTOMY  1997  . ORIF LEFT ANKLE FX  2013   retained hardware  . RADIOACTIVE SEED IMPLANT N/A 07/17/2016   Procedure: RADIOACTIVE SEED IMPLANT/BRACHYTHERAPY IMPLANT;  Surgeon: Kathie Rhodes, MD;  Location: [facility];  Service: Urology;  Laterality:  N/A;  95   seeds implanted;   no  seeds observed in bladder    Family History  Problem Relation Age of Onset  . Cancer Mother     colon received radiation therapy  . Cancer Other     neice receiving radiation therapy    Social History Social History  Substance Use Topics  . Smoking status: Never Smoker  . Smokeless tobacco: Never Used  . Alcohol use No    Prior to Admission medications   Medication Sig Start Date End Date Taking? Authorizing Provider    acetaminophen (TYLENOL) 500 MG tablet Take 1,000 mg by mouth every 6 (six) hours as needed for moderate pain or headache.   Yes Historical Provider, MD  ECHINACEA PO Take 900 mg by mouth 2 (two) times daily.   Yes Historical Provider, MD  furosemide (LASIX) 20 MG tablet Take 20 mg by mouth daily.   Yes Historical Provider, MD  Melatonin 3 MG TABS Take 3 mg by mouth at bedtime as needed (sleep).   Yes Historical Provider, MD  tamsulosin (FLOMAX) 0.4 MG CAPS capsule Take 0.4 mg by mouth daily after supper.    Yes Historical Provider, MD  vitamin C (ASCORBIC ACID) 500 MG tablet Take 500 mg by mouth daily.   Yes Historical Provider, MD    Allergies  Allergen Reactions  . Codeine Nausea And Vomiting    SEVERE  . Other Nausea And Vomiting    anesthesia causes severe nausea       Review of Systems:              General:                      normal appetite, decreased energy, no weight gain, no weight loss, no fever             Cardiac:                       no chest pain with exertion, no chest pain at rest, no SOB with exertion, no resting SOB, no PND, no orthopnea, no palpitations, no arrhythmia, no atrial fibrillation, no LE edema, no dizzy spells, no syncope             Respiratory:                 no shortness of breath, no home oxygen, no productive cough, no dry cough, no bronchitis, no wheezing, no hemoptysis, no asthma, no pain with inspiration or cough, no sleep apnea, no CPAP at night             GI:                               no difficulty swallowing, no reflux, no frequent heartburn, no hiatal hernia, no abdominal pain, no constipation, no diarrhea, no hematochezia, no hematemesis, no melena             GU:                              no dysuria,  + frequency, no urinary tract infection, no hematuria, + enlarged prostate, no kidney stones, no kidney disease             Vascular:  no pain suggestive of claudication, no pain in feet, no leg cramps, no varicose  veins, no DVT, no non-healing foot ulcer             Neuro:                         no stroke, no TIA's, no seizures, no headaches, no temporary blindness one eye,  no slurred speech, no peripheral neuropathy, no chronic pain, no instability of gait, no memory/cognitive dysfunction             Musculoskeletal:         no arthritis, no joint swelling, no myalgias, no difficulty walking, normal mobility              Skin:                            no rash, no itching, no skin infections, no pressure sores or ulcerations             Psych:                         no anxiety, no depression, no nervousness, no unusual recent stress             Eyes:                           no blurry vision, no floaters, no recent vision changes, + wears glasses or contacts             ENT:                            no hearing loss, no loose or painful teeth, no dentures, last saw dentist within the past 6 months             Hematologic:               no easy bruising, no abnormal bleeding, no clotting disorder, no frequent epistaxis             Endocrine:                   no diabetes, does not check CBG's at home                           Physical Exam:              BP (!) 143/90 (BP Location: Left Arm, Patient Position: Sitting, Cuff Size: Large)   Pulse 90   Resp 20   Ht 6\' 3"  (1.905 m)   Wt 300 lb (136.1 kg)   BMI 37.50 kg/m              General:                      Obese, o/w  well-appearing             HEENT:                       Unremarkable              Neck:  no JVD, no bruits, no adenopathy              Chest:                          clear to auscultation, symmetrical breath sounds, no wheezes, no rhonchi              CV:                              RRR, no  murmur              Abdomen:                    soft, non-tender, no masses              Extremities:                 warm, well-perfused, pulses palpable, no LE edema             Rectal/GU                    Deferred             Neuro:                         Grossly non-focal and symmetrical throughout             Skin:                            Clean and dry, no rashes, no breakdown   Diagnostic Tests:  MRI CHEST WITHOUT AND WITH CONTRAST  TECHNIQUE: Multiplanar multi sequence imaging of the chest was performed to evaluate the mediastinum both before and after administration of contrast.  CONTRAST: 26mL MULTIHANCE GADOBENATE DIMEGLUMINE 529 MG/ML IV SOLN  COMPARISON: Chest CT 06/14/2016  FINDINGS: The chest wall is unremarkable. No chest wall masses, supraclavicular or axillary lymphadenopathy.  The heart is normal in size. No pericardial effusion. The aorta is normal in caliber. No dissection. The major branch vessels are patent. The pulmonary arteries appear normal.  No mediastinal or hilar mass or lymphadenopathy. The esophagus is grossly normal.  As demonstrated on the chest CT there is a 6.3 x 4.8 x 3.8 cm pericardial mass on the right side. This is adjacent to the ascending aorta and the SVC. It appears to be located within the pericardium. It has heterogeneous T1 and T2 signal intensity but is not bright on T2 weighted sequences. It does demonstrate moderate irregular contrast enhancement. This is not a pericardial cyst or pericardial lipoma. Differential considerations would include pericardial paraganglioma, pericardial hemangioma, pericardial lymphangioma or inflammatory pseudotumor. It is diffusion negative and I think it is unlikely a sarcoma, mesothelioma or lymphoma. There are no CT or MR findings to suggest direct invasion of the heart or great vessels. Lesion is fairly well circumscribed and appears to be in capsulated.  IMPRESSION: 1. 6 cm pericardial lesion, most likely a benign neoplasm with differential diagnosis as discussed above. Resection or biopsy may be indicated. 2. No mediastinal or hilar lymphadenopathy. 3. No worrisome lung  lesions.   Electronically Signed By: Marijo Sanes M.D. On: 07/13/2016 08:43   Transthoracic Echocardiography  Patient: Jahmiere, Hearn MR #: [id number] Study Date: 09/11/2016 Gender: M Age: 27 Height: 190.5 cm Weight:  138.1 kg BSA: 2.75 m^2 Pt. Status: Room:  ATTENDING Darylene Price, M.D. ORDERING Darylene Price, M.D. REFERRING Darylene Price, M.D. PERFORMING [facility], Outpatient SONOGRAPHER Madelin Rear, RDCS  cc:  ------------------------------------------------------------------- LV EF: 65% - 70%  ------------------------------------------------------------------- Indications: V728.1 Pre-op evaluation.  ------------------------------------------------------------------- History: PMH: No prior cardiac history.  ------------------------------------------------------------------- Study Conclusions  - Left ventricle: The cavity size was normal. Systolic function was vigorous. The estimated ejection fraction was in the range of 65% to 70%. Wall motion was normal; there were no regional wall motion abnormalities. Doppler parameters are consistent with abnormal left ventricular relaxation (grade 1 diastolic dysfunction). - Aortic valve: Transvalvular velocity was within the normal range. There was no stenosis. There was no regurgitation. - Mitral valve: Transvalvular velocity was within the normal range. There was no evidence for stenosis. There was trivial regurgitation. - Right ventricle: The cavity size was normal. Wall thickness was normal. Systolic function was normal. - Atrial septum: No defect or patent foramen ovale was identified by color flow Doppler. - Tricuspid valve: There was no regurgitation. - Global longitudinal strain -17.4%.  ------------------------------------------------------------------- Study data: No prior study was available for comparison.  Study status: Routine. Procedure: The patient reported no pain pre or post test. Transthoracic echocardiography. Image quality was adequate. Study completion: There were no complications. Transthoracic echocardiography. M-mode, complete 2D, spectral Doppler, and color Doppler. Birthdate: Patient birthdate: 1956/07/13. Age: Patient is 60 yr old. Sex: Gender: male. BMI: 38.1 kg/m^2. Blood pressure: 120/80 Patient status: Outpatient. Study date: Study date: 09/11/2016. Study time: 10:58 AM. Location: Echo laboratory.  -------------------------------------------------------------------  ------------------------------------------------------------------- Left ventricle: The cavity size was normal. Systolic function was vigorous. The estimated ejection fraction was in the range of 65% to 70%. Wall motion was normal; there were no regional wall motion abnormalities. Doppler parameters are consistent with abnormal left ventricular relaxation (grade 1 diastolic dysfunction).  ------------------------------------------------------------------- Aortic valve: Trileaflet; normal thickness leaflets. Mobility was not restricted. Doppler: Transvalvular velocity was within the normal range. There was no stenosis. There was no regurgitation.  ------------------------------------------------------------------- Aorta: Aortic root: The aortic root was normal in size.  ------------------------------------------------------------------- Mitral valve: Structurally normal valve. Mobility was not restricted. Doppler: Transvalvular velocity was within the normal range. There was no evidence for stenosis. There was trivial regurgitation.  ------------------------------------------------------------------- Left atrium: The atrium was normal in size.  ------------------------------------------------------------------- Atrial septum: No defect or patent foramen  ovale was identified by color flow Doppler.  ------------------------------------------------------------------- Right ventricle: The cavity size was normal. Wall thickness was normal. Systolic function was normal.  ------------------------------------------------------------------- Pulmonic valve: Structurally normal valve. Cusp separation was normal. Doppler: Transvalvular velocity was within the normal range. There was no evidence for stenosis. There was no regurgitation.  ------------------------------------------------------------------- Tricuspid valve: Structurally normal valve. Doppler: Transvalvular velocity was within the normal range. There was no regurgitation.  ------------------------------------------------------------------- Pulmonary artery: The main pulmonary artery was normal-sized.  ------------------------------------------------------------------- Right atrium: The atrium was normal in size.  ------------------------------------------------------------------- Pericardium: There was no pericardial effusion.  ------------------------------------------------------------------- Systemic veins: Inferior vena cava: Not visualized.  ------------------------------------------------------------------- Measurements  Left ventricle Value Reference LV ID, ED, PLAX chordal (H) 52.9 mm 43 - 52 LV ID, ES, PLAX chordal 31.3 mm 23 - 38 LV fx shortening, PLAX chordal 41 % >=29 LV PW thickness, ED 9.08 mm --------- IVS/LV PW ratio, ED 1.07 <=1.3 Stroke volume, 2D 135 ml --------- Stroke volume/bsa, 2D 49 ml/m^2 --------- LV ejection fraction, 1-p A4C 72 % --------- LV end-diastolic volume, 2-p 0000000 ml --------- LV  end-systolic volume, 2-p 33 ml ---------  LV ejection fraction, 2-p 70 % --------- Stroke volume, 2-p 76 ml --------- LV end-diastolic volume/bsa, 2-p 40 ml/m^2 --------- LV end-systolic volume/bsa, 2-p 12 ml/m^2 --------- Stroke volume/bsa, 2-p 27.8 ml/m^2 --------- LV e&', lateral 8.49 cm/s --------- LV E/e&', lateral 7.95 --------- LV e&', medial 9.46 cm/s --------- LV E/e&', medial 7.14 --------- LV e&', average 8.98 cm/s --------- LV E/e&', average 7.52 --------- Longitudinal strain, TDI 17 % ---------  Ventricular septum Value Reference IVS thickness, ED 9.76 mm ---------  LVOT Value Reference LVOT ID, S 24 mm --------- LVOT area 4.52 cm^2 --------- LVOT peak velocity, S 146 cm/s --------- LVOT mean velocity, S 84.8 cm/s --------- LVOT VTI, S 29.8 cm --------- LVOT peak gradient, S 9 mm Hg ---------  Aorta Value Reference Aortic root ID, ED 33 mm ---------  Left atrium Value Reference LA ID, A-P, ES 40 mm --------- LA ID/bsa, A-P 1.45 cm/m^2 <=2.2 LA volume, S 50.6 ml --------- LA volume/bsa, S 18.4 ml/m^2 --------- LA volume, ES, 1-p A4C 43.5 ml  --------- LA volume/bsa, ES, 1-p A4C 15.8 ml/m^2 --------- LA volume, ES, 1-p A2C 51 ml --------- LA volume/bsa, ES, 1-p A2C 18.5 ml/m^2 ---------  Mitral valve Value Reference Mitral E-wave peak velocity 67.5 cm/s --------- Mitral A-wave peak velocity 82.9 cm/s --------- Mitral deceleration time (H) 387 ms 150 - 230 Mitral E/A ratio, peak 0.8 ---------  Systemic veins Value Reference Estimated CVP 3 mm Hg ---------  Right ventricle Value Reference TAPSE 23.6 mm --------- RV s&', lateral, S 11.5 cm/s ---------  Legend: (L) and (H) mark values outside specified reference range.  ------------------------------------------------------------------- Prepared and Electronically Authenticated by  Skeet Latch, MD 2017-10-09T15:59:18    CHEST 2 VIEW  COMPARISON: Chest radiograph 06/01/2016.  FINDINGS: Stable cardiac and mediastinal contours. Grossly unchanged contour abnormality along the right aspect of the mediastinum. No consolidative pulmonary opacities. No pleural effusion or pneumothorax. Mid thoracic spine degenerative changes. Cholecystectomy clips.  IMPRESSION: Unchanged contour abnormality along the right aspect mediastinum, most compatible with mediastinal mass, further evaluated on MRI 07/12/2016.   Electronically Signed By: Lovey Newcomer M.D. On: 09/18/2016 09:32    Impression:  Patient has a moderately large (6 x 5 x 4 cm) anterior mediastinal mass that has benign-appearing characteristics on CT scan and MRI that is located immediately anterior to the superior vena cava near the junction with the  right atrium. The mass is not a simple fluid-filled cyst and does not appear consistent with a simple pericardial cyst. Moreover, the location would be very atypical. The mass appears smooth and well-circumscribed, suggesting benign soft tissue etiology. In my opinion thymoma would likely be the most likely diagnosis, although the location is slightly atypical and no other significant thymic tissue is noted on scans. Ectopic thyroid tissue is a possibility, as are a variety of other soft tissue tumors. Radiographic appearance are not suggestive of lymphoma or germ cell tumor. Options include surgical resection versus observation versus CT-guided biopsy. Under the circumstances I would favor proceeding directly to surgery for definitive diagnosis and treatment.  Follow-up chest x-ray performed today demonstrates no significant interval change in radiographic appearance of the chest and in particular nothing to suggest further growth of this mass. The patient underwent routine transthoracic echocardiogram confirming the presence of normal left ventricular size and systolic function.   Plan:  I have again reviewed the indications, risks, and potential benefits of surgical resection of the patient's anterior mediastinal mass with the patient and his wife in the office today. Alternative surgical approaches  and discussed including a comparison between and conventional partial or complete median sternotomy and video assisted thoracoscopy for minimally invasive approach.  The patient understands that there is a significant chance that we may need to convert to a conventional median sternotomy if safe resection of the mass is not feasible via a mini thoracotomy approach.  The patient understands and accepts all potential associated risks of surgery including but not limited to risk of death, stroke, myocardial infarction, congestive heart failure, respiratory failure, renal failure, bleeding requiring blood  transfusion and/or reexploration, arrhythmia, pneumonia, pleural effusion, wound infection, pulmonary embolus or other thromboembolic complication, injury to the phrenic nerve, chronic pain or other delayed complications.  All questions answered.  We plan to proceed with surgery on Friday, 10/06/2016.   I spent in excess of 30 minutes during the conduct of this office consultation and >50% of this time involved direct face-to-face encounter with the patient for counseling and/or coordination of their care.   Valentina Gu. Roxy Manns, MD 09/18/2016 10:15 AM

## 2016-10-03 NOTE — Pre-Procedure Instructions (Signed)
Joe Parrish  10/03/2016      [facility] - [place], [place] - [place] [address] Phone: [phone] Fax: [phone]    Your procedure is scheduled on Friday, November 3rd, 2017.  Report to [facility] Admitting at 5:30 A.M.   Call this number if you have problems the morning of surgery:  [phone]   Remember:  Do not eat food or drink liquids after midnight.   Take these medicines the morning of surgery with A SIP OF WATER: Acetaminophen (Tylenol) if needed.   Stop taking: Aspirin, NSAIDS, Aleve, Naproxen, Ibuprofen, Advil, Motrin, BC's, Goody's, Fish oil, all herbal medications, and all vitamins.     Do not wear jewelry.  Do not wear lotions, powders, or colognes, or deoderant.  Men may shave face and neck.  Do not bring valuables to the hospital.  [facility] is not responsible for any belongings or valuables.  Contacts, dentures or bridgework may not be worn into surgery.  Leave your suitcase in the car.  After surgery it may be brought to your room.  For patients admitted to the hospital, discharge time will be determined by your treatment team.  Patients discharged the day of surgery will not be allowed to drive home.   Special instructions:  Preparing for Surgery.   [facility]- Preparing For Surgery  Before surgery, you can play an important role. Because skin is not sterile, your skin needs to be as free of germs as possible. You can reduce the number of germs on your skin by washing with CHG (chlorahexidine gluconate) Soap before surgery.  CHG is an antiseptic cleaner which kills germs and bonds with the skin to continue killing germs even after washing.  Please do not use if you have an allergy to CHG or antibacterial soaps. If your skin becomes reddened/irritated stop using the CHG.  Do not shave (including legs and underarms) for at least 48 hours prior to first CHG shower. It is OK to shave your  face.  Please follow these instructions carefully.   1. Shower the NIGHT BEFORE SURGERY and the MORNING OF SURGERY with CHG.   2. If you chose to wash your hair, wash your hair first as usual with your normal shampoo.  3. After you shampoo, rinse your hair and body thoroughly to remove the shampoo.  4. Use CHG as you would any other liquid soap. You can apply CHG directly to the skin and wash gently with a scrungie or a clean washcloth.   5. Apply the CHG Soap to your body ONLY FROM THE NECK DOWN.  Do not use on open wounds or open sores. Avoid contact with your eyes, ears, mouth and genitals (private parts). Wash genitals (private parts) with your normal soap.  6. Wash thoroughly, paying special attention to the area where your surgery will be performed.  7. Thoroughly rinse your body with warm water from the neck down.  8. DO NOT shower/wash with your normal soap after using and rinsing off the CHG Soap.  9. Pat yourself dry with a CLEAN TOWEL.   10. Wear CLEAN PAJAMAS   11. Place CLEAN SHEETS on your bed the night of your first shower and DO NOT SLEEP WITH PETS.  Day of Surgery: Do not apply any deodorants/lotions. Please wear clean clothes to the hospital/surgery center.     Please read over the following fact sheets that you were given. MRSA Information

## 2016-10-04 ENCOUNTER — Other Ambulatory Visit (HOSPITAL_COMMUNITY): Payer: Self-pay

## 2016-10-04 ENCOUNTER — Encounter (HOSPITAL_COMMUNITY)
Admission: RE | Admit: 2016-10-04 | Discharge: 2016-10-04 | Disposition: A | Discharge status: Home / Self Care | Payer: BLUE CROSS/BLUE SHIELD | Source: Ambulatory Visit | Attending: Thoracic Surgery (Cardiothoracic Vascular Surgery) | Admitting: Thoracic Surgery (Cardiothoracic Vascular Surgery)

## 2016-10-04 ENCOUNTER — Ambulatory Visit (HOSPITAL_COMMUNITY)
Admission: RE | Admit: 2016-10-04 | Discharge: 2016-10-04 | Disposition: A | Discharge status: Home / Self Care | Payer: BLUE CROSS/BLUE SHIELD | Source: Ambulatory Visit | Attending: Thoracic Surgery (Cardiothoracic Vascular Surgery) | Admitting: Thoracic Surgery (Cardiothoracic Vascular Surgery)

## 2016-10-04 ENCOUNTER — Other Ambulatory Visit: Payer: Self-pay

## 2016-10-04 ENCOUNTER — Encounter (HOSPITAL_COMMUNITY): Payer: Self-pay

## 2016-10-04 DIAGNOSIS — R222 Localized swelling, mass and lump, trunk: Secondary | ICD-10-CM | POA: Diagnosis not present

## 2016-10-04 DIAGNOSIS — J9859 Other diseases of mediastinum, not elsewhere classified: Secondary | ICD-10-CM

## 2016-10-04 HISTORY — DX: Nocturia: R35.1

## 2016-10-04 LAB — SURGICAL PCR SCREEN
MRSA, PCR: NEGATIVE
STAPHYLOCOCCUS AUREUS: NEGATIVE

## 2016-10-04 LAB — COMPREHENSIVE METABOLIC PANEL
ALT: 24 U/L (ref 17–63)
AST: 20 U/L (ref 15–41)
Albumin: 4 g/dL (ref 3.5–5.0)
Alkaline Phosphatase: 84 U/L (ref 38–126)
Anion gap: 7 (ref 5–15)
BILIRUBIN TOTAL: 0.6 mg/dL (ref 0.3–1.2)
BUN: 16 mg/dL (ref 6–20)
CHLORIDE: 108 mmol/L (ref 101–111)
CO2: 27 mmol/L (ref 22–32)
Calcium: 9.6 mg/dL (ref 8.9–10.3)
Creatinine, Ser: 0.86 mg/dL (ref 0.61–1.24)
Glucose, Bld: 107 mg/dL — ABNORMAL HIGH (ref 65–99)
POTASSIUM: 4.2 mmol/L (ref 3.5–5.1)
Sodium: 142 mmol/L (ref 135–145)
TOTAL PROTEIN: 6.3 g/dL — AB (ref 6.5–8.1)

## 2016-10-04 LAB — CBC
HEMATOCRIT: 45.7 % (ref 39.0–52.0)
Hemoglobin: 15.3 g/dL (ref 13.0–17.0)
MCH: 31.6 pg (ref 26.0–34.0)
MCHC: 33.5 g/dL (ref 30.0–36.0)
MCV: 94.4 fL (ref 78.0–100.0)
PLATELETS: 230 10*3/uL (ref 150–400)
RBC: 4.84 MIL/uL (ref 4.22–5.81)
RDW: 13.7 % (ref 11.5–15.5)
WBC: 5.7 10*3/uL (ref 4.0–10.5)

## 2016-10-04 LAB — ABO/RH: ABO/RH(D): O NEG

## 2016-10-04 LAB — BLOOD GAS, ARTERIAL
ACID-BASE EXCESS: 1.9 mmol/L (ref 0.0–2.0)
BICARBONATE: 25.8 mmol/L (ref 20.0–28.0)
DRAWN BY: 313941
FIO2: 21
O2 SAT: 95.1 %
PCO2 ART: 39.6 mmHg (ref 32.0–48.0)
PH ART: 7.429 (ref 7.350–7.450)
PO2 ART: 76.8 mmHg — AB (ref 83.0–108.0)
Patient temperature: 98.6

## 2016-10-04 LAB — URINALYSIS, ROUTINE W REFLEX MICROSCOPIC
BILIRUBIN URINE: NEGATIVE
GLUCOSE, UA: NEGATIVE mg/dL
Hgb urine dipstick: NEGATIVE
Ketones, ur: NEGATIVE mg/dL
LEUKOCYTES UA: NEGATIVE
NITRITE: NEGATIVE
PH: 6.5 (ref 5.0–8.0)
Protein, ur: NEGATIVE mg/dL
SPECIFIC GRAVITY, URINE: 1.021 (ref 1.005–1.030)

## 2016-10-04 LAB — APTT: aPTT: 32 seconds (ref 24–36)

## 2016-10-04 LAB — PROTIME-INR
INR: 1.01
PROTHROMBIN TIME: 13.3 s (ref 11.4–15.2)

## 2016-10-04 NOTE — Progress Notes (Signed)
PCP - Greig Right Cardiologist - denies Urologist - Dr. Karsten Ro  EKG - 10/04/16 CXR - 10/04/16 Echo - 09/11/16 Stress test - denies Cardiac Cath - denies  Patient denies chest pain and shortness of breath at PAT appointment.  Patient states that he has severe PONV but recently had a procedure at [facility] without PONV.

## 2016-10-05 ENCOUNTER — Encounter (HOSPITAL_COMMUNITY): Payer: Self-pay | Admitting: Anesthesiology

## 2016-10-05 NOTE — Anesthesia Preprocedure Evaluation (Addendum)
Anesthesia Evaluation  Patient identified by MRN, date of birth, ID band Patient awake    Reviewed: Allergy & Precautions, NPO status , Patient's Chart, lab work & pertinent test results  History of Anesthesia Complications (+) PONV and history of anesthetic complications  Airway Mallampati: II  TM Distance: >3 FB Neck ROM: Full    Dental  (+) Teeth Intact, Dental Advisory Given   Pulmonary neg pulmonary ROS,    breath sounds clear to auscultation       Cardiovascular negative cardio ROS   Rhythm:Regular Rate:Normal     Neuro/Psych negative neurological ROS  negative psych ROS   GI/Hepatic negative GI ROS, Neg liver ROS,   Endo/Other  negative endocrine ROS  Renal/GU negative Renal ROS  negative genitourinary   Musculoskeletal negative musculoskeletal ROS (+)   Abdominal   Peds negative pediatric ROS (+)  Hematology negative hematology ROS (+)   Anesthesia Other Findings - Mediastinal Mass  Reproductive/Obstetrics negative OB ROS                            Lab Results  Component Value Date   WBC 5.7 10/04/2016   HGB 15.3 10/04/2016   HCT 45.7 10/04/2016   MCV 94.4 10/04/2016   PLT 230 10/04/2016   Lab Results  Component Value Date   CREATININE 0.86 10/04/2016   BUN 16 10/04/2016   NA 142 10/04/2016   K 4.2 10/04/2016   CL 108 10/04/2016   CO2 27 10/04/2016  ; Lab Results  Component Value Date   INR 1.01 10/04/2016   INR 1.02 07/11/2016    EKG: NSR  Echo: - Left ventricle: The cavity size was normal. Systolic function was   vigorous. The estimated ejection fraction was in the range of 65%   to 70%. Wall motion was normal; there were no regional wall   motion abnormalities. Doppler parameters are consistent with   abnormal left ventricular relaxation (grade 1 diastolic   dysfunction). - Aortic valve: Transvalvular velocity was within the normal range.   There was  no stenosis. There was no regurgitation. - Mitral valve: Transvalvular velocity was within the normal range.   There was no evidence for stenosis. There was trivial   regurgitation. - Right ventricle: The cavity size was normal. Wall thickness was   normal. Systolic function was normal. - Atrial septum: No defect or patent foramen ovale was identified   by color flow Doppler. - Tricuspid valve: There was no regurgitation. - Global longitudinal strain -17.4%.   Anesthesia Physical Anesthesia Plan  ASA: III  Anesthesia Plan: General   Post-op Pain Management:    Induction: Intravenous  Airway Management Planned: Double Lumen EBT  Additional Equipment: Arterial line and CVP  Intra-op Plan:   Post-operative Plan: Extubation in OR  Informed Consent: I have reviewed the patients History and Physical, chart, labs and discussed the procedure including the risks, benefits and alternatives for the proposed anesthesia with the patient or authorized representative who has indicated his/her understanding and acceptance.   Dental advisory given  Plan Discussed with: CRNA  Anesthesia Plan Comments:         Anesthesia Quick Evaluation

## 2016-10-05 NOTE — Progress Notes (Signed)
Anesthesia chart review: Patient is a 60 year old male scheduled for right VATS for resection of mediastinal mass on 10/06/2016 by Dr. Roxy Manns.  History includes mediastinal mass, nonsmoker, postoperative nausea and vomiting (severe), prostate cancer s/p radioactive seed implant 07/17/16, cholecystectomy. BMI is consistent with obesity.   PCP is Dr. Greig Right. Urologist is Dr. Karsten Ro. RAD-ONC is Dr. Tammi Klippel.  Meds include Echinacea, Lasix, melatonin, Flomax, vitamin C.  BP (!) 145/79   Pulse 80   Temp 36.7 C   Resp 20   Ht 6\' 3"  (1.905 m)   Wt (!) 311 lb 8 oz (141.3 kg)   SpO2 99%   BMI 38.93 kg/m   10/04/16 EKG: NSR, LAD, LVH with QRS widening. V1 has a right BBB pattern.  09/11/16 Echo: Study Conclusions - Left ventricle: The cavity size was normal. Systolic function was   vigorous. The estimated ejection fraction was in the range of 65%   to 70%. Wall motion was normal; there were no regional wall   motion abnormalities. Doppler parameters are consistent with   abnormal left ventricular relaxation (grade 1 diastolic   dysfunction). - Aortic valve: Transvalvular velocity was within the normal range.   There was no stenosis. There was no regurgitation. - Mitral valve: Transvalvular velocity was within the normal range.   There was no evidence for stenosis. There was trivial   regurgitation. - Right ventricle: The cavity size was normal. Wall thickness was   normal. Systolic function was normal. - Atrial septum: No defect or patent foramen ovale was identified   by color flow Doppler. - Tricuspid valve: There was no regurgitation. - Global longitudinal strain -17.4%.  10/04/16 CXR: IMPRESSION: Persistent right pericardial mass. Reference is made to prior MRI report 07/12/2016. No acute cardiopulmonary disease.  Preoperative labs noted.   If no acute changes then I anticipate that he can proceed as planned. Of note, he did report severe post-operative N/V, but did much  better with anesthesia for his 07/17/16 surgery. Anesthesia records are in Epic for review (meds included two total doses of Zofran 4 mg and 10 mg of dexamethasone).   [facility] Short Stay Center/Anesthesiology Phone [phone] 10/05/2016 9:58 AM

## 2016-10-06 ENCOUNTER — Encounter (HOSPITAL_COMMUNITY): Payer: Self-pay | Admitting: *Deleted

## 2016-10-06 ENCOUNTER — Inpatient Hospital Stay (HOSPITAL_COMMUNITY): Payer: BLUE CROSS/BLUE SHIELD | Admitting: Anesthesiology

## 2016-10-06 ENCOUNTER — Inpatient Hospital Stay (HOSPITAL_COMMUNITY): Payer: BLUE CROSS/BLUE SHIELD

## 2016-10-06 ENCOUNTER — Inpatient Hospital Stay (HOSPITAL_COMMUNITY): Payer: BLUE CROSS/BLUE SHIELD | Admitting: Vascular Surgery

## 2016-10-06 ENCOUNTER — Encounter (HOSPITAL_COMMUNITY)
Admission: RE | Disposition: A | Discharge status: Home / Self Care | Payer: Self-pay | Source: Ambulatory Visit | Attending: Thoracic Surgery (Cardiothoracic Vascular Surgery)

## 2016-10-06 ENCOUNTER — Inpatient Hospital Stay (HOSPITAL_COMMUNITY)
Admission: RE | Admit: 2016-10-06 | Discharge: 2016-10-08 | DRG: 828 | Disposition: A | Discharge status: Home / Self Care | Payer: BLUE CROSS/BLUE SHIELD | Source: Ambulatory Visit | Attending: Thoracic Surgery (Cardiothoracic Vascular Surgery) | Admitting: Thoracic Surgery (Cardiothoracic Vascular Surgery)

## 2016-10-06 DIAGNOSIS — C61 Malignant neoplasm of prostate: Secondary | ICD-10-CM | POA: Diagnosis present

## 2016-10-06 DIAGNOSIS — R05 Cough: Secondary | ICD-10-CM | POA: Diagnosis not present

## 2016-10-06 DIAGNOSIS — N400 Enlarged prostate without lower urinary tract symptoms: Secondary | ICD-10-CM | POA: Diagnosis not present

## 2016-10-06 DIAGNOSIS — Z6837 Body mass index (BMI) 37.0-37.9, adult: Secondary | ICD-10-CM | POA: Diagnosis not present

## 2016-10-06 DIAGNOSIS — J9811 Atelectasis: Secondary | ICD-10-CM | POA: Diagnosis not present

## 2016-10-06 DIAGNOSIS — R222 Localized swelling, mass and lump, trunk: Secondary | ICD-10-CM | POA: Diagnosis not present

## 2016-10-06 DIAGNOSIS — J9859 Other diseases of mediastinum, not elsewhere classified: Secondary | ICD-10-CM | POA: Diagnosis present

## 2016-10-06 DIAGNOSIS — D4989 Neoplasm of unspecified behavior of other specified sites: Secondary | ICD-10-CM | POA: Diagnosis not present

## 2016-10-06 DIAGNOSIS — Z809 Family history of malignant neoplasm, unspecified: Secondary | ICD-10-CM

## 2016-10-06 DIAGNOSIS — Z885 Allergy status to narcotic agent status: Secondary | ICD-10-CM

## 2016-10-06 DIAGNOSIS — Z888 Allergy status to other drugs, medicaments and biological substances status: Secondary | ICD-10-CM

## 2016-10-06 DIAGNOSIS — Z9889 Other specified postprocedural states: Secondary | ICD-10-CM

## 2016-10-06 DIAGNOSIS — Z79899 Other long term (current) drug therapy: Secondary | ICD-10-CM | POA: Diagnosis not present

## 2016-10-06 DIAGNOSIS — E669 Obesity, unspecified: Secondary | ICD-10-CM | POA: Diagnosis not present

## 2016-10-06 DIAGNOSIS — D150 Benign neoplasm of thymus: Secondary | ICD-10-CM | POA: Diagnosis not present

## 2016-10-06 HISTORY — PX: RESECTION OF MEDIASTINAL MASS: SHX6497

## 2016-10-06 HISTORY — DX: Obesity, unspecified: E66.9

## 2016-10-06 HISTORY — PX: VIDEO ASSISTED THORACOSCOPY: SHX5073

## 2016-10-06 LAB — GLUCOSE, CAPILLARY
GLUCOSE-CAPILLARY: 135 mg/dL — AB (ref 65–99)
Glucose-Capillary: 152 mg/dL — ABNORMAL HIGH (ref 65–99)
Glucose-Capillary: 198 mg/dL — ABNORMAL HIGH (ref 65–99)

## 2016-10-06 LAB — PREPARE RBC (CROSSMATCH)

## 2016-10-06 SURGERY — VIDEO ASSISTED THORACOSCOPY
Anesthesia: General | Site: Chest | Laterality: Right

## 2016-10-06 MED ORDER — TRAMADOL HCL 50 MG PO TABS
50.0000 mg | ORAL_TABLET | Freq: Four times a day (QID) | ORAL | Status: DC | PRN
  Administered 2016-10-07: 50 mg via ORAL
  Filled 2016-10-06: qty 1

## 2016-10-06 MED ORDER — INSULIN ASPART 100 UNIT/ML ~~LOC~~ SOLN
0.0000 [IU] | SUBCUTANEOUS | Status: DC
  Administered 2016-10-06: 2 [IU] via SUBCUTANEOUS
  Administered 2016-10-06: 4 [IU] via SUBCUTANEOUS
  Administered 2016-10-07 (×2): 2 [IU] via SUBCUTANEOUS

## 2016-10-06 MED ORDER — DEXAMETHASONE SODIUM PHOSPHATE 10 MG/ML IJ SOLN
INTRAMUSCULAR | Status: AC
  Filled 2016-10-06: qty 1

## 2016-10-06 MED ORDER — FENTANYL 40 MCG/ML IV SOLN
INTRAVENOUS | Status: AC
  Filled 2016-10-06: qty 25

## 2016-10-06 MED ORDER — LACTATED RINGERS IV SOLN
INTRAVENOUS | Status: DC | PRN
  Administered 2016-10-06 (×2): via INTRAVENOUS

## 2016-10-06 MED ORDER — ACETAMINOPHEN 500 MG PO TABS
1000.0000 mg | ORAL_TABLET | Freq: Four times a day (QID) | ORAL | Status: DC
  Administered 2016-10-06 – 2016-10-08 (×8): 1000 mg via ORAL
  Filled 2016-10-06 (×7): qty 2

## 2016-10-06 MED ORDER — SODIUM CHLORIDE 0.9% FLUSH: 9.0000 mL | INTRAVENOUS | Status: DC | PRN

## 2016-10-06 MED ORDER — ROCURONIUM BROMIDE 100 MG/10ML IV SOLN
INTRAVENOUS | Status: DC | PRN
  Administered 2016-10-06: 10 mg via INTRAVENOUS
  Administered 2016-10-06: 60 mg via INTRAVENOUS
  Administered 2016-10-06: 40 mg via INTRAVENOUS

## 2016-10-06 MED ORDER — DIPHENHYDRAMINE HCL 12.5 MG/5ML PO ELIX: 12.5000 mg | ORAL_SOLUTION | Freq: Four times a day (QID) | ORAL | Status: DC | PRN

## 2016-10-06 MED ORDER — ARTIFICIAL TEARS OP OINT
TOPICAL_OINTMENT | OPHTHALMIC | Status: DC | PRN
  Administered 2016-10-06: 1 via OPHTHALMIC

## 2016-10-06 MED ORDER — ROCURONIUM BROMIDE 10 MG/ML (PF) SYRINGE
PREFILLED_SYRINGE | INTRAVENOUS | Status: AC
  Filled 2016-10-06: qty 10

## 2016-10-06 MED ORDER — MIDAZOLAM HCL 2 MG/2ML IJ SOLN
INTRAMUSCULAR | Status: AC
  Filled 2016-10-06: qty 2

## 2016-10-06 MED ORDER — TRAMADOL HCL 50 MG PO TABS
50.0000 mg | ORAL_TABLET | Freq: Four times a day (QID) | ORAL | Status: DC | PRN
  Administered 2016-10-07: 50 mg via ORAL
  Administered 2016-10-07: 100 mg via ORAL
  Filled 2016-10-06: qty 2
  Filled 2016-10-06: qty 1

## 2016-10-06 MED ORDER — BUPIVACAINE 0.5 % ON-Q PUMP SINGLE CATH 400 ML
400.0000 mL | INJECTION | Status: AC
  Administered 2016-10-06: 400 mL
  Filled 2016-10-06: qty 400

## 2016-10-06 MED ORDER — FENTANYL CITRATE (PF) 250 MCG/5ML IJ SOLN
INTRAMUSCULAR | Status: AC
  Filled 2016-10-06: qty 5

## 2016-10-06 MED ORDER — METOCLOPRAMIDE HCL 5 MG/ML IJ SOLN
10.0000 mg | Freq: Four times a day (QID) | INTRAMUSCULAR | Status: DC
  Administered 2016-10-06 – 2016-10-07 (×3): 10 mg via INTRAVENOUS
  Filled 2016-10-06 (×4): qty 2

## 2016-10-06 MED ORDER — FENTANYL CITRATE (PF) 100 MCG/2ML IJ SOLN
INTRAMUSCULAR | Status: DC | PRN
  Administered 2016-10-06 (×2): 50 ug via INTRAVENOUS
  Administered 2016-10-06: 150 ug via INTRAVENOUS
  Administered 2016-10-06 (×2): 100 ug via INTRAVENOUS
  Administered 2016-10-06: 50 ug via INTRAVENOUS

## 2016-10-06 MED ORDER — ONDANSETRON HCL 4 MG/2ML IJ SOLN
4.0000 mg | Freq: Four times a day (QID) | INTRAMUSCULAR | Status: DC | PRN
  Administered 2016-10-06 (×2): 4 mg via INTRAVENOUS
  Filled 2016-10-06: qty 2

## 2016-10-06 MED ORDER — ONDANSETRON HCL 4 MG/2ML IJ SOLN
INTRAMUSCULAR | Status: DC | PRN
  Administered 2016-10-06 (×2): 4 mg via INTRAVENOUS

## 2016-10-06 MED ORDER — OXYCODONE HCL 5 MG PO TABS: 5.0000 mg | ORAL_TABLET | ORAL | Status: DC | PRN

## 2016-10-06 MED ORDER — FENTANYL CITRATE (PF) 250 MCG/5ML IJ SOLN
INTRAMUSCULAR | Status: AC
  Filled 2016-10-06: qty 10

## 2016-10-06 MED ORDER — PROPOFOL 10 MG/ML IV BOLUS
INTRAVENOUS | Status: DC | PRN
Start: 2016-10-06 — End: 2016-10-06
  Administered 2016-10-06: 200 mg via INTRAVENOUS

## 2016-10-06 MED ORDER — ACETAMINOPHEN 160 MG/5ML PO SOLN: 1000.0000 mg | Freq: Four times a day (QID) | ORAL | Status: DC

## 2016-10-06 MED ORDER — MEPERIDINE HCL 25 MG/ML IJ SOLN: 6.2500 mg | INTRAMUSCULAR | Status: DC | PRN

## 2016-10-06 MED ORDER — SCOPOLAMINE 1 MG/3DAYS TD PT72
MEDICATED_PATCH | TRANSDERMAL | Status: AC
  Filled 2016-10-06: qty 1

## 2016-10-06 MED ORDER — TAMSULOSIN HCL 0.4 MG PO CAPS
0.4000 mg | ORAL_CAPSULE | Freq: Every day | ORAL | Status: DC
  Administered 2016-10-06 – 2016-10-07 (×2): 0.4 mg via ORAL
  Filled 2016-10-06 (×3): qty 1

## 2016-10-06 MED ORDER — HYDROMORPHONE HCL 1 MG/ML IJ SOLN
0.2500 mg | INTRAMUSCULAR | Status: DC | PRN
  Administered 2016-10-06 (×4): 0.5 mg via INTRAVENOUS

## 2016-10-06 MED ORDER — SUGAMMADEX SODIUM 200 MG/2ML IV SOLN
INTRAVENOUS | Status: DC | PRN
  Administered 2016-10-06: 500 mg via INTRAVENOUS

## 2016-10-06 MED ORDER — LACTATED RINGERS IV SOLN: INTRAVENOUS | Status: DC

## 2016-10-06 MED ORDER — HYDROMORPHONE HCL 2 MG/ML IJ SOLN
INTRAMUSCULAR | Status: AC
  Filled 2016-10-06: qty 1

## 2016-10-06 MED ORDER — DEXTROSE 5 % IV SOLN
1.5000 g | INTRAVENOUS | Status: AC
  Administered 2016-10-06: 1.5 g via INTRAVENOUS
  Filled 2016-10-06: qty 1.5

## 2016-10-06 MED ORDER — KCL IN DEXTROSE-NACL 20-5-0.45 MEQ/L-%-% IV SOLN
INTRAVENOUS | Status: DC
  Administered 2016-10-06: 14:00:00 via INTRAVENOUS
  Filled 2016-10-06 (×3): qty 1000

## 2016-10-06 MED ORDER — BUPIVACAINE HCL (PF) 0.5 % IJ SOLN
INTRAMUSCULAR | Status: AC
  Filled 2016-10-06: qty 10

## 2016-10-06 MED ORDER — KETOROLAC TROMETHAMINE 15 MG/ML IJ SOLN
15.0000 mg | Freq: Four times a day (QID) | INTRAMUSCULAR | Status: AC
  Administered 2016-10-06 – 2016-10-07 (×5): 15 mg via INTRAVENOUS
  Filled 2016-10-06 (×5): qty 1

## 2016-10-06 MED ORDER — POTASSIUM CHLORIDE 10 MEQ/50ML IV SOLN: 10.0000 meq | Freq: Every day | INTRAVENOUS | Status: DC | PRN

## 2016-10-06 MED ORDER — ONDANSETRON HCL 4 MG/2ML IJ SOLN: 4.0000 mg | Freq: Four times a day (QID) | INTRAMUSCULAR | Status: DC | PRN

## 2016-10-06 MED ORDER — SENNOSIDES-DOCUSATE SODIUM 8.6-50 MG PO TABS
1.0000 | ORAL_TABLET | Freq: Every day | ORAL | Status: DC
  Administered 2016-10-07 (×2): 1 via ORAL
  Filled 2016-10-06 (×2): qty 1

## 2016-10-06 MED ORDER — PROPOFOL 10 MG/ML IV BOLUS
INTRAVENOUS | Status: AC
  Filled 2016-10-06: qty 20

## 2016-10-06 MED ORDER — METOCLOPRAMIDE HCL 5 MG/ML IJ SOLN: 10.0000 mg | Freq: Four times a day (QID) | INTRAMUSCULAR | Status: DC | PRN

## 2016-10-06 MED ORDER — LACTATED RINGERS IV SOLN
INTRAVENOUS | Status: DC | PRN
  Administered 2016-10-06: 07:00:00 via INTRAVENOUS

## 2016-10-06 MED ORDER — ONDANSETRON HCL 4 MG/2ML IJ SOLN
INTRAMUSCULAR | Status: AC
  Filled 2016-10-06: qty 4

## 2016-10-06 MED ORDER — BISACODYL 5 MG PO TBEC
10.0000 mg | DELAYED_RELEASE_TABLET | Freq: Every day | ORAL | Status: DC
  Administered 2016-10-07: 10 mg via ORAL
  Filled 2016-10-06: qty 2

## 2016-10-06 MED ORDER — BUPIVACAINE HCL (PF) 0.5 % IJ SOLN
INTRAMUSCULAR | Status: DC | PRN
  Administered 2016-10-06: 10 mL

## 2016-10-06 MED ORDER — 0.9 % SODIUM CHLORIDE (POUR BTL) OPTIME
TOPICAL | Status: DC | PRN
  Administered 2016-10-06: 2000 mL

## 2016-10-06 MED ORDER — PROMETHAZINE HCL 25 MG/ML IJ SOLN: 6.2500 mg | INTRAMUSCULAR | Status: DC | PRN

## 2016-10-06 MED ORDER — SCOPOLAMINE 1 MG/3DAYS TD PT72
MEDICATED_PATCH | TRANSDERMAL | Status: DC | PRN
  Administered 2016-10-06: 1 via TRANSDERMAL

## 2016-10-06 MED ORDER — LIDOCAINE 2% (20 MG/ML) 5 ML SYRINGE
INTRAMUSCULAR | Status: AC
  Filled 2016-10-06: qty 5

## 2016-10-06 MED ORDER — LIDOCAINE HCL (CARDIAC) 20 MG/ML IV SOLN
INTRAVENOUS | Status: DC | PRN
  Administered 2016-10-06: 60 mg via INTRAVENOUS

## 2016-10-06 MED ORDER — NALOXONE HCL 0.4 MG/ML IJ SOLN: 0.4000 mg | INTRAMUSCULAR | Status: DC | PRN

## 2016-10-06 MED ORDER — DIPHENHYDRAMINE HCL 50 MG/ML IJ SOLN: 12.5000 mg | Freq: Four times a day (QID) | INTRAMUSCULAR | Status: DC | PRN

## 2016-10-06 MED ORDER — PHENYLEPHRINE HCL 10 MG/ML IJ SOLN
INTRAVENOUS | Status: DC | PRN
  Administered 2016-10-06: 40 ug/min via INTRAVENOUS

## 2016-10-06 MED ORDER — FENTANYL 40 MCG/ML IV SOLN
INTRAVENOUS | Status: DC
  Administered 2016-10-06: 11:00:00 via INTRAVENOUS

## 2016-10-06 MED ORDER — MIDAZOLAM HCL 5 MG/5ML IJ SOLN
INTRAMUSCULAR | Status: DC | PRN
  Administered 2016-10-06: 2 mg via INTRAVENOUS

## 2016-10-06 MED ORDER — DEXAMETHASONE SODIUM PHOSPHATE 10 MG/ML IJ SOLN
INTRAMUSCULAR | Status: DC | PRN
  Administered 2016-10-06: 10 mg via INTRAVENOUS

## 2016-10-06 SURGICAL SUPPLY — 74 items
BAG DECANTER FOR FLEXI CONT (MISCELLANEOUS) IMPLANT
BLADE SURG 11 STRL SS (BLADE) ×3 IMPLANT
CANISTER SUCTION 2500CC (MISCELLANEOUS) ×3 IMPLANT
CATH KIT ON Q 5IN SLV (PAIN MANAGEMENT) IMPLANT
CATH KIT ON-Q SILVERSOAK 5IN (CATHETERS) ×3 IMPLANT
CATH THORACIC 28FR (CATHETERS) IMPLANT
CATH THORACIC 28FR RT ANG (CATHETERS) IMPLANT
CATH THORACIC 36FR (CATHETERS) IMPLANT
CATH THORACIC 36FR RT ANG (CATHETERS) IMPLANT
CELLS DAT CNTRL 66122 CELL SVR (MISCELLANEOUS) ×2 IMPLANT
CHERRY SPONGEY 1/2 (GAUZE/BANDAGES/DRESSINGS) IMPLANT
CONT SPEC 4OZ CLIKSEAL STRL BL (MISCELLANEOUS) ×6 IMPLANT
COVER BACK TABLE 24X17X13 BIG (DRAPES) ×3 IMPLANT
COVER SURGICAL LIGHT HANDLE (MISCELLANEOUS) IMPLANT
DERMABOND ADVANCED (GAUZE/BANDAGES/DRESSINGS) ×1
DERMABOND ADVANCED .7 DNX12 (GAUZE/BANDAGES/DRESSINGS) ×2 IMPLANT
DRAPE LAPAROSCOPIC ABDOMINAL (DRAPES) ×3 IMPLANT
DRAPE SLUSH/WARMER DISC (DRAPES) ×3 IMPLANT
DRAPE WARM FLUID 44X44 (DRAPE) IMPLANT
ELECT REM PT RETURN 9FT ADLT (ELECTROSURGICAL) ×3
ELECTRODE REM PT RTRN 9FT ADLT (ELECTROSURGICAL) ×2 IMPLANT
GAUZE SPONGE 4X4 12PLY STRL (GAUZE/BANDAGES/DRESSINGS) ×3 IMPLANT
GLOVE BIO SURGEON STRL SZ 6.5 (GLOVE) ×3 IMPLANT
GLOVE BIO SURGEON STRL SZ7 (GLOVE) ×6 IMPLANT
GLOVE BIOGEL PI IND STRL 7.0 (GLOVE) ×2 IMPLANT
GLOVE BIOGEL PI INDICATOR 7.0 (GLOVE) ×1
GLOVE ORTHO TXT STRL SZ7.5 (GLOVE) ×6 IMPLANT
GOWN STRL REUS W/ TWL LRG LVL3 (GOWN DISPOSABLE) ×8 IMPLANT
GOWN STRL REUS W/TWL LRG LVL3 (GOWN DISPOSABLE) ×4
HEMOSTAT SURGICEL 2X14 (HEMOSTASIS) IMPLANT
KIT BASIN OR (CUSTOM PROCEDURE TRAY) ×3 IMPLANT
KIT ROOM TURNOVER OR (KITS) ×3 IMPLANT
KIT SUCTION CATH 14FR (SUCTIONS) ×3 IMPLANT
NS IRRIG 1000ML POUR BTL (IV SOLUTION) ×6 IMPLANT
PACK CHEST (CUSTOM PROCEDURE TRAY) ×3 IMPLANT
PAD ARMBOARD 7.5X6 YLW CONV (MISCELLANEOUS) ×6 IMPLANT
RTRCTR WOUND ALEXIS 18CM MED (MISCELLANEOUS) ×3
SEALANT SURG COSEAL 4ML (VASCULAR PRODUCTS) IMPLANT
SLEEVE ENDOPATH XCEL 5M (ENDOMECHANICALS) IMPLANT
SOLUTION ANTI FOG 6CC (MISCELLANEOUS) ×3 IMPLANT
SPECIMEN JAR LG PLASTIC EMPTY (MISCELLANEOUS) IMPLANT
SPECIMEN JAR MEDIUM (MISCELLANEOUS) ×3 IMPLANT
STAPLER VISISTAT 35W (STAPLE) IMPLANT
SUT MNCRL AB 3-0 PS2 18 (SUTURE) ×3 IMPLANT
SUT PROLENE 3 0 SH DA (SUTURE) IMPLANT
SUT PROLENE 4 0 RB 1 (SUTURE)
SUT PROLENE 4-0 RB1 .5 CRCL 36 (SUTURE) IMPLANT
SUT SILK  1 MH (SUTURE)
SUT SILK 1 MH (SUTURE) IMPLANT
SUT SILK 2 0SH CR/8 30 (SUTURE) IMPLANT
SUT SILK 3 0 SH CR/8 (SUTURE) ×3 IMPLANT
SUT SILK 3 0SH CR/8 30 (SUTURE) IMPLANT
SUT VIC AB 1 CTX 36 (SUTURE) ×1
SUT VIC AB 1 CTX36XBRD ANBCTR (SUTURE) ×2 IMPLANT
SUT VIC AB 2-0 CT1 27 (SUTURE)
SUT VIC AB 2-0 CT1 TAPERPNT 27 (SUTURE) IMPLANT
SUT VIC AB 2-0 CTX 36 (SUTURE) ×6 IMPLANT
SUT VIC AB 3-0 MH 27 (SUTURE) IMPLANT
SUT VIC AB 3-0 SH 27 (SUTURE)
SUT VIC AB 3-0 SH 27X BRD (SUTURE) IMPLANT
SUT VIC AB 3-0 SH 8-18 (SUTURE) ×6 IMPLANT
SUT VICRYL 0 UR6 27IN ABS (SUTURE) IMPLANT
SUT VICRYL 2 TP 1 (SUTURE) ×3 IMPLANT
SWAB COLLECTION DEVICE MRSA (MISCELLANEOUS) IMPLANT
SYSTEM SAHARA CHEST DRAIN ATS (WOUND CARE) ×3 IMPLANT
TAPE CLOTH 4X10 WHT NS (GAUZE/BANDAGES/DRESSINGS) IMPLANT
TOWEL OR 17X24 6PK STRL BLUE (TOWEL DISPOSABLE) ×3 IMPLANT
TOWEL OR 17X26 10 PK STRL BLUE (TOWEL DISPOSABLE) ×3 IMPLANT
TRAP SPECIMEN MUCOUS 40CC (MISCELLANEOUS) IMPLANT
TRAY FOLEY CATH 14FRSI W/METER (CATHETERS) IMPLANT
TROCAR XCEL NON-BLD 5MMX100MML (ENDOMECHANICALS) ×3 IMPLANT
TUBE ANAEROBIC SPECIMEN COL (MISCELLANEOUS) IMPLANT
TUNNELER SHEATH ON-Q 11GX8 DSP (PAIN MANAGEMENT) ×3 IMPLANT
WATER STERILE IRR 1000ML POUR (IV SOLUTION) ×6 IMPLANT

## 2016-10-06 NOTE — Op Note (Addendum)
CARDIOTHORACIC SURGERY OPERATIVE NOTE  Date of Procedure:   10/06/2016  Preoperative Diagnosis:  Anterior Mediastinal Mass  Postoperative Diagnosis:  same  Procedure:    Right Video Assisted Thoracoscopy and Mini-Thoracotomy for Resection of Anterior Mediastinal Mass  Surgeon:    Valentina Gu. Roxy Manns, MD  Assistant:    Nani Skillern, PA-C  Anesthesia:    Crissie Sickles. Smith Robert, MD  Operative Findings:   Large, firm, well encapsulated mass in anterior mediastinum with no signs of direct invasion into adjacent structures  Preliminary frozen section histology c/w low grade neoplasm with grossly negative margins            BRIEF CLINICAL NOTE AND INDICATIONS FOR SURGERY  Patient is a 60 year old moderately obese but otherwise healthy white male who recently underwent routine preoperative chest x-ray prior to surgery for radioactive seed implantation for treatment of prostate cancer. Follow-up chest CT scan and MRI scan revealed the presence of a 4 x 6 cm benign-appearing cystic mass in the anterior mediastinum. The patient underwent uncomplicated radioactive seed implantation for treatment of his prostate cancer by Dr. Karsten Ro on 07/17/2016. He has been referred for elective thoracic surgical consultation.  The patient has been seen in consultation and counseled at length regarding the indications, risks and potential benefits of surgery.  All questions have been answered, and the patient provides full informed consent for the operation as described.   DETAILS OF THE OPERATIVE PROCEDURE  The patient is brought to the operating room on the above mentioned date and placed in the supine position on the operative table. A radial arterial line and central venous catheter placed by the anesthesia team. Pneumatic sequential compression boots are placed on both lower legs. Intravenous antibiotics given. General endotracheal anesthesia is induced uneventfully.  The patient is intubated using a  dual lumen endotracheal tube. A Foley catheter is placed. The patient's anterior chest and right side are prepared and draped in a sterile manner.  A time out procedure is performed. A right anterior miniature thoracotomy incision is made. The incision is placed overlying the third inner costal space laterally. The pectoralis major muscle is retracted medially and completely preserved. Single lung ventilation was begun. The right pleural space is entered through the third intercostal space. A soft tissue retractor is placed. A 5 mm port is placed through the anterior axillary line and the fifth intercostal space. A 30 video endoscopic camera is passed through the port.   The right chest was explored visually. There is an obvious large mass in the anterior mediastinum located anterior to the phrenic nerve. There are thin adhesions between the medial surface of the right lung and the external surface of the mass which are easily divided with electrocautery.  The mass appears to be well encapsulated. There is no invasion of the medial surface of the right lung whatsoever. Dissection is begun inferiorly and using electrocautery the pericardial fat is divided until the external surface of the pericardium is identified. The mass is carefully dissected off of the anterior surface of the pericardium. There is no sign of any direct invasion through the pericardium. Dissection was continued medially where the anterior mediastinal fat is divided with a generous margin away from the firm mass. This is continued until the mass is been completely dissected off of the anterior surface of the pericardium to the level of the innominate vein. Dissection is now continued posteriorly and the mass is notably located well anterior to the phrenic nerve. The mass is completely  removed from the chest without any apparent dissection into the capsule of the mass itself. The mass is sent to pathology for both frozen section and permanent  histology.  Preliminary frozen section histology is reported to demonstrate a low-grade neoplasm not otherwise specified. Margins are reportedly "grossly negative".  The chest is inspected for meticulous hemostasis and irrigated with saline solution.  A single lumen On-Q catheter is placed through the 5 mm port incision and tunneled into the subpleural space posteriorly to cover the 2nd through the 5th intercostal nerve roots.  The catheter is flushed with 5 mL of 0.5% bupivacaine and connected to a continuous infusion pump. The right pleural spaces drained using a single 32 French chest tube placed through the 5 mm port incision. The minithoracotomy incision is closed multiple layers and the skin incision is closed with a subcuticular skin closure.  The patient tolerated the procedure well, is expected in the operative room, and transported to the postanesthesia care unit in stable condition. There are no intraoperative complications. All sponge, instrument, and needle counts are verified correct. Estimated blood loss was less than 100 mL.     Valentina Gu. Roxy Manns MD 10/06/2016 10:08 AM

## 2016-10-06 NOTE — Anesthesia Procedure Notes (Signed)
Anesthesia Procedure Note RIJ CVP Dual Lumen [id number]: The patient was identified and consent obtained.  TO was performed, and full barrier precautions were used.  The skin was anesthetized with lidocaine.  Once the vein was located with the 22 ga. needle using ultrasound guidance , the wire was inserted into the vein.  The wire location was confirmed with ultrasound.  The tissue was dilated and the catheter was carefully inserted, then sutured in place. A dressing was applied. The patient tolerated the procedure well.

## 2016-10-06 NOTE — Care Management Note (Signed)
Case Management Note  Patient Details  Name: Joe Parrish MRN: [id number] Date of Birth: 10/29/1956  Subjective/Objective:    S/p VATS                Action/Plan:  PTA from home with wife.  CM will continue to follow for discharge needs   Expected Discharge Date:                  Expected Discharge Plan:     In-House Referral:     Discharge planning Services  CM Consult  Post Acute Care Choice:    Choice offered to:     DME Arranged:    DME Agency:     HH Arranged:    HH Agency:     Status of Service:  In process, will continue to follow  If discussed at Long Length of Stay Meetings, dates discussed:    Additional Comments:  Maryclare Labrador, RN 10/06/2016, 4:04 PM

## 2016-10-06 NOTE — Transfer of Care (Signed)
Immediate Anesthesia Transfer of Care Note  Patient: Joe Parrish  Procedure(s) Performed: Procedure(s): VIDEO ASSISTED ANTERIOR THORACOTOMY (Right) RESECTION OF MEDIASTINAL MASS (Right)  Patient Location: PACU  Anesthesia Type:General  Level of Consciousness: awake, alert  and oriented  Airway & Oxygen Therapy: Patient Spontanous Breathing and Patient connected to face mask oxygen  Post-op Assessment: Report given to RN, Post -op Vital signs reviewed and stable and Patient moving all extremities X 4  Post vital signs: Reviewed and stable  Last Vitals:  Vitals:   10/06/16 0621  BP: 134/77  Pulse: 76  Resp: 20  Temp: 37 C    Last Pain:  Vitals:   10/06/16 0621  TempSrc: Oral         Complications: No apparent anesthesia complications

## 2016-10-06 NOTE — Brief Op Note (Signed)
10/06/2016  10:02 AM  PATIENT:  Joe Parrish  60 y.o. male  PRE-OPERATIVE DIAGNOSIS:  MEDIASTINAL MASS  POST-OPERATIVE DIAGNOSIS:  MEDIASTINAL MASS  PROCEDURE:  Procedure(s): VIDEO ASSISTED ANTERIOR THORACOTOMY (Right) RESECTION OF MEDIASTINAL MASS (Right)  SURGEON:  Surgeon(s) and Role:    * Rexene Alberts, MD - Primary  PHYSICIAN ASSISTANT: Nani Skillern, PA-C   ANESTHESIA:   general  EBL:  Total I/O In: 2000 [I.V.:2000] Out: 200 [Urine:200]  BLOOD ADMINISTERED:none  DRAINS: single 32 French Chest Tube(s) in the right pleural space   LOCAL MEDICATIONS USED:  BUPIVICAINE   SPECIMEN:  Source of Specimen:  anterior mediastinal mass  DISPOSITION OF SPECIMEN:  PATHOLOGY  COUNTS:  YES  TOURNIQUET:  * No tourniquets in log *  DICTATION: .Note written in EPIC  PLAN OF CARE: Admit to inpatient   PATIENT DISPOSITION:  PACU - hemodynamically stable.   Delay start of Pharmacological VTE agent (>24hrs) due to surgical blood loss or risk of bleeding: yes  Rexene Alberts, MD 10/06/2016 10:03 AM

## 2016-10-06 NOTE — Interval H&P Note (Signed)
History and Physical Interval Note:  10/06/2016 6:03 AM  Joe Parrish  has presented today for surgery, with the diagnosis of MEDIASTINAL MASS  The various methods of treatment have been discussed with the patient and family. After consideration of risks, benefits and other options for treatment, the patient has consented to  Procedure(s): VIDEO ASSISTED THORACOSCOPY (Right) RESECTION OF MEDIASTINAL MASS (Right) as a surgical intervention .  The patient's history has been reviewed, patient examined, no change in status, stable for surgery.  I have reviewed the patient's chart and labs.  Questions were answered to the patient's satisfaction.     Rexene Alberts

## 2016-10-06 NOTE — Brief Op Note (Signed)
10/06/2016  10:11 AM  PATIENT:  Joe Parrish  60 y.o. male  PRE-OPERATIVE DIAGNOSIS:  MEDIASTINAL MASS  POST-OPERATIVE DIAGNOSIS:  MEDIASTINAL MASS  PROCEDURE:  RIGHT VIDEO ASSISTED ANTERIOR THORACOTOMY with RESECTION OF MEDIASTINAL MASS , and ON Q PLACEMENT  SURGEON:  Surgeon(s) and Role:    * Rexene Alberts, MD - Primary  PHYSICIAN ASSISTANT: Lars Pinks PA-C  ANESTHESIA:   general  EBL:  Total I/O In: 2000 [I.V.:2000] Out: 200 [Urine:200]  BLOOD ADMINISTERED:none  DRAINS: 32 Bard drain placed in the right pleural space   LOCAL MEDICATIONS USED:  MARCAINE     SPECIMEN:  Source of Specimen:  Mediastinal mass  DISPOSITION OF SPECIMEN:  Pathology. 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 Margins grossly negative. Await final pathology  COUNTS CORRECT:  YES  DICTATION: .Dragon Dictation  PLAN OF CARE: Admit to inpatient   PATIENT DISPOSITION:  PACU - hemodynamically stable.   Delay start of Pharmacological VTE agent (>24hrs) due to surgical blood loss or risk of bleeding: yes

## 2016-10-06 NOTE — Anesthesia Procedure Notes (Addendum)
Procedure Name: Intubation Date/Time: 10/06/2016 7:45 AM Performed by: Neldon Newport Pre-anesthesia Checklist: Timeout performed, Patient being monitored, Suction available, Emergency Drugs available and Patient identified Patient Re-evaluated:Patient Re-evaluated prior to inductionOxygen Delivery Method: Circle system utilized Preoxygenation: Pre-oxygenation with 100% oxygen Intubation Type: IV induction Ventilation: Mask ventilation without difficulty Laryngoscope Size: 3 Grade View: Grade II Endobronchial tube: Left and Double lumen EBT and 41 Fr Airway Equipment and Method: Fiberoptic brochoscope Placement Confirmation: breath sounds checked- equal and bilateral,  positive ETCO2 and ETT inserted through vocal cords under direct vision Secured at: 24 cm Tube secured with: Tape Dental Injury: Injury to lip

## 2016-10-06 NOTE — Discharge Summary (Signed)
Physician Discharge Summary       [id number]       [place] [address]             [phone]    Patient ID: Joe Parrish MRN: [id number] DOB/AGE: Aug 06, 1956 60 y.o.  Admit date: 10/06/2016 Discharge date: 10/08/2016  Admission Diagnoses: Mediastinal mass  Active Diagnoses:  1. Obesity (BMI 35.0-39.9 without comorbidity) 2.Hyperplasia of prostate with lower urinary tract symptoms (LUTS) 3. Prostate cancer ([place]) 4. PONV (postoperative nausea and vomiting)  Procedure (s):  Right Video Assisted Thoracoscopy and Mini-Thoracotomy for Resection of Anterior Mediastinal Mass by Dr. Roxy Manns on 10/06/2016.  Pathology: pending  History of Presenting Illness: This is a 60 year old moderately obese but otherwise healthy white male who recently underwent routine preoperative chest x-ray prior to surgery for radioactive seed implantation for treatment of prostate cancer. Follow-up chest CT scan and MRI scan revealed the presence of a 4 x 6 cm benign-appearing cystic mass in the anterior mediastinum. The patient underwent uncomplicated radioactive seed implantation for treatment of his prostate cancer by Dr. Karsten Ro on 07/17/2016. He has been referred for elective thoracic surgical consultation.  The patient is married and lives with his wife in [place]. He has been retired for approximately 4 years, having previously worked for [employer]. 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 He reports no significant physical limitations up until recently, although he admits that for the past few months he has had decreased energy which he blames on his Lupron injections. He denies any history of exertional chest pain, chest tightness, or shortness of breath. He denies any other types of chest pain including any sort of pain with deep inspiration or cough. He denies any history of fevers, chills, productive cough, or  persistent dry cough. He has no difficulty swallowing. Appetite is been stable. He has not been gaining or losing weight.  Patient returned to the office on 10/01/2016 for follow-up of anterior mediastinal mass originally discovered on routine preoperative chest radiograph. Subsequent CT scan and MRI confirmed the presence of a moderately large (6 x 5 x 4 cm) anterior mediastinal mass with benign appearing characteristics. He was originally seen in consultation on 08/08/2016. At that time he did not wish to proceed with surgery for personal reasons but he returns at this time with hopes to proceed sometime later this year. The patient reports no new problems or complaints over the last couple of months. He states that he still has significant fluid retention that originally began after he underwent Lupron injection early this year. He otherwise feels well. He specifically denies any history of chest pain or chest tightness either with activity or at rest. He denies any fevers, chills, or productive cough.  Patienthas a moderately large (6 x 5 x 4 cm) anterior mediastinal mass that has benign-appearing characteristics on CT scan and MRI that is located immediately anterior to the superior vena cava near the junction with the right atrium. The mass is not a simple fluid-filled cyst and does not appear consistent with a simple pericardial cyst. Moreover, the location would be very atypical. The mass appears smooth and well-circumscribed, suggesting benign soft tissue etiology. In my opinion thymoma would likely be the most likely diagnosis, although the location is slightly atypical and no other significant thymic tissue is noted on scans. Ectopic thyroid tissue is a possibility, as are a variety of other soft tissue tumors. Radiographic  appearance are not suggestive of lymphoma or germ cell tumor. Options include surgical resection versus observation versus CT-guided biopsy. Under the circumstances I would  favor proceeding directly to surgery for definitive diagnosis and treatment. Follow-up chest x-ray performed today demonstrates no significant interval change in radiographic appearance of the chest and in particular nothing to suggest further growth of this mass. The patient underwent routine transthoracic echocardiogram confirming the presence of normal left ventricular size and systolic function.   Patient agreed to mini thoracotomy approach (conversion to median sternotomy if need be). He was admitted on 10/06/2016 in order to undergo a right video assisted thoracoscopy and mini-thoracotomy for resection of anterior mediastinal mass.  Brief Hospital Course:  Mr. Richburg underwent a right video-assisted thoracoscopy and mini-thoracotomy for resection of anterior mediastinal mass. He tolerated the procedure well and was transferred to the ICU. He was extubated in a timely manner. His chest tube was discontinued POD 1. He had some neurological dysfunction with his right upper extremity which resolved the following day. He was ambulating with limited assistance and his pain was well controlled on tylenol and toradol. Pathology from surgery is still pending. He has an appointment with Dr. Roxy Manns in a few weeks. He will need to get his chest tube sutures removed next week at our office. He was felt ready for discharge today.     Latest Vital Signs: Blood pressure (!) 114/95, pulse 75, temperature 99 F (37.2 C), temperature source Oral, resp. rate 16, height 6\' 3"  (1.905 m), weight (!) 313 lb 0.9 oz (142 kg), SpO2 95 %.  Physical Exam:  General appearance: alert and cooperative Neurologic: intact Heart: regular rate and rhythm, S1, S2 normal, no murmur, click, rub or gallop Lungs: clear to auscultation bilaterally Wound: incision looks good  Discharge Condition:Stable and discharged to home.  Recent laboratory studies:  Lab Results  Component Value Date   WBC 9.1 10/08/2016   HGB 13.2  10/08/2016   HCT 40.2 10/08/2016   MCV 94.1 10/08/2016   PLT 233 10/08/2016   Lab Results  Component Value Date   NA 137 10/08/2016   K 4.2 10/08/2016   CL 102 10/08/2016   CO2 26 10/08/2016   CREATININE 0.93 10/08/2016   GLUCOSE 102 (H) 10/08/2016     Diagnostic Studies:   Dg Chest Port 1 View  Result Date: 10/06/2016 CLINICAL DATA:  59 year old male status post VATS and resection of mediastinal mass. Initial encounter. EXAM: PORTABLE CHEST 1 VIEW COMPARISON:  10/04/2016 and earlier. FINDINGS: Portable AP semi upright view at 1105 hours. Right chest tube in place. Right IJ central line in place, tip at the level of the subclavian and jugular venous confluence or innominate vein. No pneumothorax identified. Lower lung volumes. Increased right peritracheal density, other mediastinal contours are stable. Diffuse increased pulmonary interstitial opacity and superior perihilar atelectasis including along the right minor fissure. No pleural effusion identified. Visualized tracheal air column is within normal limits. IMPRESSION: 1. Right chest tube in place with no pneumothorax. 2. Right IJ central line, tip at the subclavian/jugular venous confluence or innominate vein level. 3. Low lung volumes with perihilar atelectasis and pulmonary vascular congestion. Electronically Signed   By: Genevie Ann M.D.   On: 10/06/2016 11:30   Discharge Medications:   Medication List    STOP taking these medications   ECHINACEA PO     TAKE these medications   acetaminophen 500 MG tablet Commonly known as:  TYLENOL Take 1,000 mg by mouth every 6 (  six) hours as needed for moderate pain or headache.   furosemide 20 MG tablet Commonly known as:  LASIX Take 20 mg by mouth daily.   Melatonin 3 MG Tabs Take 3 mg by mouth at bedtime as needed (sleep).   tamsulosin 0.4 MG Caps capsule Commonly known as:  FLOMAX Take 0.4 mg by mouth daily after supper.   vitamin C 500 MG tablet Commonly known as:  ASCORBIC  ACID Take 500 mg by mouth daily.       Follow Up Appointments: Follow-up Information    Rexene Alberts, MD Follow up on 10/23/2016.   Specialty:  Cardiothoracic Surgery Why:  PA/LAT CXR to be taken (at [facility] which is in the same building as Dr. Guy Sandifer office) on 10/23/2016 at 12:00 pm;Appointment time is at 12:30 pm Contact information: [place]  [address] [phone]        nursing. Call in 1 day(s).   Why:  Call on Monday 11/6 for a suture removal appointment Friday on 11/10 Contact information: Dr. Guy Sandifer office       Greig Right, MD. Call in 1 day(s).   Specialty:  Family Medicine Contact information: [place] [address] [phone]           Signed: Alvester Chou 10/08/2016, 9:13 AM

## 2016-10-06 NOTE — Progress Notes (Signed)
Patient ID: Joe Parrish, male   DOB: 1956-09-18, 60 y.o.   MRN: [id number]   SICU Evening Rounds:   Hemodynamically stable     Urine output good   CT output low  CBC    Component Value Date/Time   WBC 5.7 10/04/2016 1002   RBC 4.84 10/04/2016 1002   HGB 15.3 10/04/2016 1002   HCT 45.7 10/04/2016 1002   PLT 230 10/04/2016 1002   MCV 94.4 10/04/2016 1002   MCH 31.6 10/04/2016 1002   MCHC 33.5 10/04/2016 1002   RDW 13.7 10/04/2016 1002     BMET    Component Value Date/Time   NA 142 10/04/2016 1002   K 4.2 10/04/2016 1002   CL 108 10/04/2016 1002   CO2 27 10/04/2016 1002   GLUCOSE 107 (H) 10/04/2016 1002   BUN 16 10/04/2016 1002   CREATININE 0.86 10/04/2016 1002   CALCIUM 9.6 10/04/2016 1002   GFRNONAA >60 10/04/2016 1002   GFRAA >60 10/04/2016 1002     A/P:  Stable postop course. Continue current plans

## 2016-10-06 NOTE — Anesthesia Postprocedure Evaluation (Signed)
Anesthesia Post Note  Patient: Joe Parrish  Procedure(s) Performed: Procedure(s) (LRB): VIDEO ASSISTED ANTERIOR THORACOTOMY (Right) RESECTION OF MEDIASTINAL MASS (Right)  Patient location during evaluation: PACU Anesthesia Type: General Level of consciousness: awake and alert Pain management: pain level controlled Vital Signs Assessment: post-procedure vital signs reviewed and stable Respiratory status: spontaneous breathing, nonlabored ventilation, respiratory function stable and patient connected to nasal cannula oxygen Cardiovascular status: blood pressure returned to baseline and stable Postop Assessment: no signs of nausea or vomiting Anesthetic complications: no    Last Vitals:  Vitals:   10/06/16 1300 10/06/16 1400  BP: 123/60 (!) 116/54  Pulse: 81 81  Resp: 14 15  Temp:      Last Pain:  Vitals:   10/06/16 1410  TempSrc:   PainSc: [name]

## 2016-10-07 ENCOUNTER — Encounter (HOSPITAL_COMMUNITY): Payer: Self-pay | Admitting: Thoracic Surgery (Cardiothoracic Vascular Surgery)

## 2016-10-07 LAB — BASIC METABOLIC PANEL
ANION GAP: 4 — AB (ref 5–15)
BUN: 13 mg/dL (ref 6–20)
CHLORIDE: 106 mmol/L (ref 101–111)
CO2: 26 mmol/L (ref 22–32)
Calcium: 8.7 mg/dL — ABNORMAL LOW (ref 8.9–10.3)
Creatinine, Ser: 0.91 mg/dL (ref 0.61–1.24)
GFR calc non Af Amer: >60 mL/min (ref >60)
Glucose, Bld: 124 mg/dL — ABNORMAL HIGH (ref 65–99)
POTASSIUM: 4.3 mmol/L (ref 3.5–5.1)
SODIUM: 136 mmol/L (ref 135–145)

## 2016-10-07 LAB — CBC
HEMATOCRIT: 38.4 % — AB (ref 39.0–52.0)
HEMOGLOBIN: 12.6 g/dL — AB (ref 13.0–17.0)
MCH: 30.7 pg (ref 26.0–34.0)
MCHC: 32.8 g/dL (ref 30.0–36.0)
MCV: 93.7 fL (ref 78.0–100.0)
Platelets: 237 10*3/uL (ref 150–400)
RBC: 4.1 MIL/uL — AB (ref 4.22–5.81)
RDW: 13.4 % (ref 11.5–15.5)
WBC: 12.2 10*3/uL — ABNORMAL HIGH (ref 4.0–10.5)

## 2016-10-07 LAB — AFP TUMOR MARKER: AFP TUMOR MARKER: 3.2 ng/mL (ref 0.0–8.3)

## 2016-10-07 LAB — GLUCOSE, CAPILLARY: GLUCOSE-CAPILLARY: 137 mg/dL — AB (ref 65–99)

## 2016-10-07 LAB — BETA HCG QUANT (REF LAB)

## 2016-10-07 MED ORDER — KETOROLAC TROMETHAMINE 15 MG/ML IJ SOLN
15.0000 mg | Freq: Four times a day (QID) | INTRAMUSCULAR | Status: DC | PRN
  Administered 2016-10-07 – 2016-10-08 (×2): 15 mg via INTRAVENOUS
  Filled 2016-10-07 (×2): qty 1

## 2016-10-07 NOTE — Progress Notes (Signed)
Pt states right arm is much weaker than left, mainly from elbow down.  No other change in neuro assessment.  Dr. Cyndia Bent notified.  Will continue to closely monitor.

## 2016-10-07 NOTE — Progress Notes (Signed)
1 Day Post-Op Procedure(s) (LRB): VIDEO ASSISTED ANTERIOR THORACOTOMY (Right) RESECTION OF MEDIASTINAL MASS (Right) Subjective:  Only complaint if of difficulty controlling movement of right arm which he did not notice until this am. Says it started with some numbness in little finger.  Objective: Vital signs in last 24 hours: Temp:  [97.5 F (36.4 C)-99.2 F (37.3 C)] 98.4 F (36.9 C) (11/04 0700) Pulse Rate:  [49-89] 52 (11/04 0700) Cardiac Rhythm: Normal sinus rhythm (11/04 0400) Resp:  [11-18] 11 (11/04 0700) BP: (87-146)/(44-76) 103/58 (11/04 0700) SpO2:  [95 %-100 %] 97 % (11/04 0700) Arterial Line BP: (77-167)/(53-118) 87/60 (11/04 0400)  Hemodynamic parameters for last 24 hours:    Intake/Output from previous day: 11/03 0701 - 11/04 0700 In: 3930 [P.O.:240; I.V.:3690] Out: 3295 [Urine:2975; Blood:20; Chest Tube:300] Intake/Output this shift: No intake/output data recorded.  General appearance: alert and cooperative Neurologic: difficulty controlling RUE movement. He has some weakness in grip. flexion and extension of wrist is strong. flexion and extension of elbow is strong. some weakness in shoulder.  Heart: regular rate and rhythm, S1, S2 normal, no murmur, click, rub or gallop Lungs: clear to auscultation bilaterally Wound: dressing dry no air leak from chest tube  Lab Results:  Recent Labs  10/07/16 0500  WBC 12.2*  HGB 12.6*  HCT 38.4*  PLT 237   BMET:  Recent Labs  10/07/16 0500  NA 136  K 4.3  CL 106  CO2 26  GLUCOSE 124*  BUN 13  CREATININE 0.91  CALCIUM 8.7*    PT/INR: No results for input(s): LABPROT, INR in the last 72 hours. ABG    Component Value Date/Time   PHART 7.429 10/04/2016 1025   HCO3 25.8 10/04/2016 1025   O2SAT 95.1 10/04/2016 1025   CBG (last 3)   Recent Labs  10/06/16 2013 10/06/16 2335 10/07/16 0414  GLUCAP 198* 152* 137*    Assessment/Plan: S/P Procedure(s) (LRB): VIDEO ASSISTED ANTERIOR THORACOTOMY  (Right) RESECTION OF MEDIASTINAL MASS (Right)  He is hemodynamically stable  DC chest tube and foley  Keep central line in.  His RUE has some neurologic dysfunction which may be due to positioning. He also had arterial line in that wrist with arm board in place. He does not have any other neurologic symptoms. Will continue to observe.  If he can get up and ambulate I think he could go to stepdown later today.   LOS: 1 day    Gaye Pollack 10/07/2016

## 2016-10-07 NOTE — Progress Notes (Signed)
Patient ID: Joe Parrish, male   DOB: [birth year], 60 y.o.   MRN: [id number]  SICU evening rounds:  Hemodynamically stable in sinus rhythm  Had BM  His RUE weakness and incoordination is much improved this pm. Still has some numbness along the outside of his arm.

## 2016-10-08 ENCOUNTER — Inpatient Hospital Stay (HOSPITAL_COMMUNITY): Payer: BLUE CROSS/BLUE SHIELD

## 2016-10-08 LAB — COMPREHENSIVE METABOLIC PANEL
ALBUMIN: 3.2 g/dL — AB (ref 3.5–5.0)
ALK PHOS: 70 U/L (ref 38–126)
ALT: 22 U/L (ref 17–63)
ANION GAP: 9 (ref 5–15)
AST: 25 U/L (ref 15–41)
BILIRUBIN TOTAL: 0.6 mg/dL (ref 0.3–1.2)
BUN: 13 mg/dL (ref 6–20)
CALCIUM: 8.7 mg/dL — AB (ref 8.9–10.3)
CO2: 26 mmol/L (ref 22–32)
CREATININE: 0.93 mg/dL (ref 0.61–1.24)
Chloride: 102 mmol/L (ref 101–111)
GFR calc Af Amer: >60 mL/min (ref >60)
GFR calc non Af Amer: >60 mL/min (ref >60)
GLUCOSE: 102 mg/dL — AB (ref 65–99)
Potassium: 4.2 mmol/L (ref 3.5–5.1)
Sodium: 137 mmol/L (ref 135–145)
TOTAL PROTEIN: 5.8 g/dL — AB (ref 6.5–8.1)

## 2016-10-08 LAB — CBC
HEMATOCRIT: 40.2 % (ref 39.0–52.0)
HEMOGLOBIN: 13.2 g/dL (ref 13.0–17.0)
MCH: 30.9 pg (ref 26.0–34.0)
MCHC: 32.8 g/dL (ref 30.0–36.0)
MCV: 94.1 fL (ref 78.0–100.0)
Platelets: 233 10*3/uL (ref 150–400)
RBC: 4.27 MIL/uL (ref 4.22–5.81)
RDW: 13.7 % (ref 11.5–15.5)
WBC: 9.1 10*3/uL (ref 4.0–10.5)

## 2016-10-08 NOTE — Progress Notes (Signed)
Right IJ TL catheter D/c'd.  Pressure dsg applied.  Discussed d/c instructions extensively with pt and wife.  Reviewed medications and "After Thoracotomy Care" handout.  Pt and wife verbalize understanding of d/c instructions and importance of followup appointments. Pt taken to discharge area via wheelchair.

## 2016-10-08 NOTE — Progress Notes (Signed)
2 Days Post-Op Procedure(s) (LRB): VIDEO ASSISTED ANTERIOR THORACOTOMY (Right) RESECTION OF MEDIASTINAL MASS (Right) Subjective:  No complaints. His RUE symptoms have completely resolved. He wants to go home. Walking well. Pain well-controlled with Tylenol and Toradol.  Objective: Vital signs in last 24 hours: Temp:  [97.9 F (36.6 C)-99 F (37.2 C)] 99 F (37.2 C) (11/05 0700) Pulse Rate:  [63-93] 75 (11/05 0800) Cardiac Rhythm: Normal sinus rhythm (11/05 0800) Resp:  [12-30] 16 (11/05 0800) BP: (93-138)/(46-95) 114/95 (11/05 0700) SpO2:  [90 %-99 %] 95 % (11/05 0800) Weight:  [142 kg (313 lb 0.9 oz)] 142 kg (313 lb 0.9 oz) (11/05 0400)  Hemodynamic parameters for last 24 hours:    Intake/Output from previous day: 11/04 0701 - 11/05 0700 In: 360 [P.O.:360] Out: 3235 [Urine:3225; Chest Tube:10] Intake/Output this shift: No intake/output data recorded.  General appearance: alert and cooperative Neurologic: intact Heart: regular rate and rhythm, S1, S2 normal, no murmur, click, rub or gallop Lungs: clear to auscultation bilaterally Wound: incision looks good  Lab Results:  Recent Labs  10/07/16 0500 10/08/16 0426  WBC 12.2* 9.1  HGB 12.6* 13.2  HCT 38.4* 40.2  PLT 237 233   BMET:  Recent Labs  10/07/16 0500 10/08/16 0426  NA 136 137  K 4.3 4.2  CL 106 102  CO2 26 26  GLUCOSE 124* 102*  BUN 13 13  CREATININE 0.91 0.93  CALCIUM 8.7* 8.7*    PT/INR: No results for input(s): LABPROT, INR in the last 72 hours. ABG    Component Value Date/Time   PHART 7.429 10/04/2016 1025   HCO3 25.8 10/04/2016 1025   O2SAT 95.1 10/04/2016 1025   CBG (last 3)   Recent Labs  10/06/16 2013 10/06/16 2335 10/07/16 0414  GLUCAP 198* 152* 137*   CXR looks good  Assessment/Plan: S/P Procedure(s) (LRB): VIDEO ASSISTED ANTERIOR THORACOTOMY (Right) RESECTION OF MEDIASTINAL MASS (Right)  He is doing very well and wants to go home. I think that is fine. His RUE  weakness, numbness and incoordination have completely resolved. I think this was likely temporary neuropraxia from postioning or arterial line and wrist immobilization. Pathology is still pending but Dr. Roxy Manns can follow up on that this week. He will take ibuprofen or pain at home and already has some oxy IR at home if really needed. He will need to come back in a week for nurses in office to remove CT suture and the follow up with [facility] in 3 weeks with CXR.   LOS: 2 days    Gaye Pollack 10/08/2016

## 2016-10-08 NOTE — Discharge Instructions (Addendum)
Thoracotomy, Care After °Refer to this sheet in the next few weeks. These instructions provide you with information on caring for yourself after your procedure. Your health care provider may also give you more specific instructions. Your treatment has been planned according to current medical practices, but problems sometimes occur. Call your health care provider if you have any problems or questions after your procedure. °WHAT TO EXPECT AFTER YOUR PROCEDURE °After your procedure, it is typical to have the following sensations: °· You may feel pain at the incision site. °· You may be constipated from the pain medicine given and the change in your level of activity. °· You may feel extremely tired. °HOME CARE INSTRUCTIONS °· Take over-the-counter or prescription medicines for pain, discomfort, or fever only as directed by your health care provider. It is very important to take pain relieving medicine before your pain becomes severe. You will be able to breathe and cough more comfortably if your pain is well controlled. °· Take deep breaths. Deep breathing helps to keep your lungs inflated and protects against a lung infection (pneumonia). °· Cough frequently. Even though coughing may cause discomfort, coughing is important to clear mucus (phlegm) and expand your lungs. Coughing helps prevent pneumonia. If it hurts to cough, hold a pillow against your chest when you cough. This may help with the discomfort. °· Continue to use an incentive spirometer as directed. The use of an incentive spirometer helps to keep your lungs inflated and protects against pneumonia. °· Change the bandages over your incision as needed or as directed by your health care provider. °· Remove the bandages over your chest tube site as directed by your health care provider. °· Resume your normal diet as directed. It is important to have adequate protein, calories, vitamins, and minerals to promote healing. °· Prevent constipation. °¨ Eat  high-fiber foods such as whole grain cereals and breads, brown rice, beans, and fresh fruits and vegetables. °¨ Drink enough water and fluids to keep your urine clear or pale yellow. Avoid drinking beverages containing caffeine. Beverages containing caffeine can cause dehydration and harden your stool. °¨ Talk to your health care provider about taking a stool softener or laxative. °· Avoid lifting until you are instructed otherwise. °· Do not drive until directed by your health care provider.  Do not drive while taking pain medicines (narcotics). °· Do not bathe, swim, or use a hot tub until directed by your health care provider. You may shower instead. Gently wash the area of your incision with water and soap as directed. Do not use anything else to clean your incision except as directed by your health care provider. °· Do not use any tobacco products including cigarettes, chewing tobacco, or electronic cigarettes. °· Avoid secondhand smoke. °· Schedule an appointment for stitch (suture) or staple removal as directed. °· Schedule and attend all follow-up visits as directed by your health care provider. It is important to keep all your appointments. °· Participate in pulmonary rehabilitation as directed by your health care provider. °· Do not travel by airplane for 2 weeks after your chest tube is removed. °SEEK MEDICAL CARE IF: °· You are bleeding from your wounds. °· Your heartbeat seems irregular. °· You have redness, swelling, or increasing pain in the wounds. °· There is pus coming from your wounds. °· There is a bad smell coming from the wound or dressing. °· You have a fever or chills. °· You have nausea or are vomiting. °· You have muscle aches. °SEEK   IMMEDIATE MEDICAL CARE IF: °· You have a rash. °· You have difficulty breathing. °· You have a reaction or side effect to medicines given. °· You have persistent nausea. °· You have lightheadedness or feel faint. °· You have shortness of breath or chest  pain. °· You have persistent pain. °  °This information is not intended to replace advice given to you by your health care provider. Make sure you discuss any questions you have with your health care provider. °  °Document Released: 05/05/2011 Document Revised: 04/06/2015 Document Reviewed: 07/09/2013 °Elsevier Interactive Patient Education ©2016 [employer] ° °

## 2016-10-09 LAB — TYPE AND SCREEN
ABO/RH(D): O NEG
ANTIBODY SCREEN: NEGATIVE
UNIT DIVISION: 0
Unit division: 0

## 2016-10-09 LAB — GLUCOSE, CAPILLARY: GLUCOSE-CAPILLARY: 113 mg/dL — AB (ref 65–99)

## 2016-10-10 ENCOUNTER — Telehealth: Payer: Self-pay | Admitting: Thoracic Surgery (Cardiothoracic Vascular Surgery)

## 2016-10-10 ENCOUNTER — Encounter: Payer: Self-pay | Admitting: Thoracic Surgery (Cardiothoracic Vascular Surgery)

## 2016-10-10 ENCOUNTER — Telehealth: Payer: Self-pay | Admitting: *Deleted

## 2016-10-10 NOTE — Telephone Encounter (Signed)
Called patient to discuss final pathology report c/w thymoma, all surgical margins negative  Rexene Alberts, MD 10/10/2016 3:47 PM

## 2016-10-10 NOTE — Telephone Encounter (Signed)
Joe Parrish called today to ask if it would be alright for her niece to remove Joe Parrish chest tube sutures on Sunday since they live in [place]. She is a Marine scientist. I said it would be fine, but for her to call the office if there were any concerns with the appearance of the operative sites. She agreed with this plan.

## 2016-10-11 DIAGNOSIS — J069 Acute upper respiratory infection, unspecified: Secondary | ICD-10-CM | POA: Diagnosis not present

## 2016-10-20 ENCOUNTER — Other Ambulatory Visit: Payer: Self-pay | Admitting: Thoracic Surgery (Cardiothoracic Vascular Surgery)

## 2016-10-20 DIAGNOSIS — J9859 Other diseases of mediastinum, not elsewhere classified: Secondary | ICD-10-CM

## 2016-10-23 ENCOUNTER — Ambulatory Visit: Payer: Self-pay | Admitting: Thoracic Surgery (Cardiothoracic Vascular Surgery)

## 2016-10-24 ENCOUNTER — Ambulatory Visit: Payer: Self-pay | Admitting: Thoracic Surgery (Cardiothoracic Vascular Surgery)

## 2016-10-30 ENCOUNTER — Ambulatory Visit
Admission: RE | Admit: 2016-10-30 | Discharge: 2016-10-30 | Disposition: A | Discharge status: Home / Self Care | Payer: BLUE CROSS/BLUE SHIELD | Source: Ambulatory Visit | Attending: Thoracic Surgery (Cardiothoracic Vascular Surgery) | Admitting: Thoracic Surgery (Cardiothoracic Vascular Surgery)

## 2016-10-30 ENCOUNTER — Ambulatory Visit (INDEPENDENT_AMBULATORY_CARE_PROVIDER_SITE_OTHER): Payer: Self-pay | Admitting: Physician Assistant

## 2016-10-30 VITALS — BP 135/88 | HR 81 | Resp 16 | Ht 75.0 in | Wt 306.0 lb

## 2016-10-30 DIAGNOSIS — J9859 Other diseases of mediastinum, not elsewhere classified: Secondary | ICD-10-CM

## 2016-10-30 DIAGNOSIS — Z09 Encounter for follow-up examination after completed treatment for conditions other than malignant neoplasm: Secondary | ICD-10-CM

## 2016-10-30 DIAGNOSIS — J90 Pleural effusion, not elsewhere classified: Secondary | ICD-10-CM | POA: Diagnosis not present

## 2016-10-30 NOTE — Progress Notes (Signed)
  HPI:  Patient returns for routine postoperative follow-up having undergone Right Mini Anterior Thoracotomy with resection of Mediastinal Mass 10/06/2016. The patient's early postoperative recovery while in the hospital was unremarkable and he was discharged home on POD #2. Since hospital discharge the patient reports he is doing very well.  He has no complaints of chest pain or shortness of breath.  He states he occasionally has trouble if he rolls to his right side at night, but he notices this has been improving.  He is ambulating independently and would like to resume normal activity.   Current Outpatient Prescriptions  Medication Sig Dispense Refill  . acetaminophen (TYLENOL) 500 MG tablet Take 1,000 mg by mouth every 6 (six) hours as needed for moderate pain or headache.    . furosemide (LASIX) 20 MG tablet Take 20 mg by mouth daily.    . Melatonin 3 MG TABS Take 3 mg by mouth at bedtime as needed (sleep).    . tamsulosin (FLOMAX) 0.4 MG CAPS capsule Take 0.4 mg by mouth daily after supper.     . vitamin C (ASCORBIC ACID) 500 MG tablet Take 500 mg by mouth daily.     No current facility-administered medications for this visit.     Physical Exam:  BP 135/88   Pulse 81   Resp 16   Ht 6\' 3"  (1.905 m)   Wt (!) 306 lb (138.8 kg)   SpO2 93% Comment: ON RA  BMI 38.25 kg/m   Gen: no apparent distress Heart: RRR Lungs: mildly diminished right base Abd: soft non-tender, non-distended Incisions: C/D/I... Well healed  Diagnostic Tests:  CXR: small right pleural effusion  A/P;  1. S/P Mini Thoracotomy with Resection Anterior Mediastinal Mass.... Final pathology was Thymoma 2. H/O Prostate Cancer 3. Activity as tolerated, with weight restricted to 10-15 lbs and can increase as tolerated, once 6 weeks from surgery 4. RTC prn   Ellwood Handler, PA-C [facility] [phone]

## 2016-11-08 DIAGNOSIS — Z8546 Personal history of malignant neoplasm of prostate: Secondary | ICD-10-CM | POA: Diagnosis not present

## 2016-11-15 DIAGNOSIS — N5201 Erectile dysfunction due to arterial insufficiency: Secondary | ICD-10-CM | POA: Diagnosis not present

## 2016-11-15 DIAGNOSIS — Z8546 Personal history of malignant neoplasm of prostate: Secondary | ICD-10-CM | POA: Diagnosis not present

## 2017-03-08 DIAGNOSIS — R21 Rash and other nonspecific skin eruption: Secondary | ICD-10-CM | POA: Diagnosis not present

## 2017-03-15 DIAGNOSIS — Z23 Encounter for immunization: Secondary | ICD-10-CM | POA: Diagnosis not present

## 2017-03-15 DIAGNOSIS — Z0000 Encounter for general adult medical examination without abnormal findings: Secondary | ICD-10-CM | POA: Diagnosis not present

## 2017-03-15 DIAGNOSIS — Z6841 Body Mass Index (BMI) 40.0 and over, adult: Secondary | ICD-10-CM | POA: Diagnosis not present

## 2017-03-15 DIAGNOSIS — Z1389 Encounter for screening for other disorder: Secondary | ICD-10-CM | POA: Diagnosis not present

## 2017-03-20 DIAGNOSIS — N3943 Post-void dribbling: Secondary | ICD-10-CM | POA: Diagnosis not present

## 2017-03-20 DIAGNOSIS — R3915 Urgency of urination: Secondary | ICD-10-CM | POA: Diagnosis not present

## 2017-03-20 DIAGNOSIS — Z8546 Personal history of malignant neoplasm of prostate: Secondary | ICD-10-CM | POA: Diagnosis not present

## 2017-03-20 DIAGNOSIS — R310 Gross hematuria: Secondary | ICD-10-CM | POA: Diagnosis not present

## 2017-05-15 DIAGNOSIS — Z8546 Personal history of malignant neoplasm of prostate: Secondary | ICD-10-CM | POA: Diagnosis not present

## 2017-05-22 DIAGNOSIS — R3915 Urgency of urination: Secondary | ICD-10-CM | POA: Diagnosis not present

## 2017-05-22 DIAGNOSIS — Z8546 Personal history of malignant neoplasm of prostate: Secondary | ICD-10-CM | POA: Diagnosis not present

## 2017-05-22 DIAGNOSIS — N5201 Erectile dysfunction due to arterial insufficiency: Secondary | ICD-10-CM | POA: Diagnosis not present

## 2017-05-22 DIAGNOSIS — N401 Enlarged prostate with lower urinary tract symptoms: Secondary | ICD-10-CM | POA: Diagnosis not present

## 2017-10-09 DIAGNOSIS — Z23 Encounter for immunization: Secondary | ICD-10-CM | POA: Diagnosis not present

## 2017-11-09 DIAGNOSIS — N529 Male erectile dysfunction, unspecified: Secondary | ICD-10-CM | POA: Diagnosis not present

## 2017-11-09 DIAGNOSIS — N401 Enlarged prostate with lower urinary tract symptoms: Secondary | ICD-10-CM | POA: Diagnosis not present

## 2017-11-20 DIAGNOSIS — R351 Nocturia: Secondary | ICD-10-CM | POA: Diagnosis not present

## 2017-11-20 DIAGNOSIS — Z8546 Personal history of malignant neoplasm of prostate: Secondary | ICD-10-CM | POA: Diagnosis not present

## 2017-11-20 DIAGNOSIS — R310 Gross hematuria: Secondary | ICD-10-CM | POA: Diagnosis not present

## 2017-11-20 DIAGNOSIS — N401 Enlarged prostate with lower urinary tract symptoms: Secondary | ICD-10-CM | POA: Diagnosis not present

## 2018-04-24 DIAGNOSIS — R12 Heartburn: Secondary | ICD-10-CM | POA: Diagnosis not present

## 2018-05-27 DIAGNOSIS — Z8546 Personal history of malignant neoplasm of prostate: Secondary | ICD-10-CM | POA: Diagnosis not present

## 2018-05-27 DIAGNOSIS — E291 Testicular hypofunction: Secondary | ICD-10-CM | POA: Diagnosis not present

## 2018-06-04 DIAGNOSIS — N400 Enlarged prostate without lower urinary tract symptoms: Secondary | ICD-10-CM | POA: Diagnosis not present

## 2018-06-04 DIAGNOSIS — Z8546 Personal history of malignant neoplasm of prostate: Secondary | ICD-10-CM | POA: Diagnosis not present

## 2018-06-24 IMAGING — CR DG CHEST 2V
2 series · 2 of 2 positions shown · non-contrast
Comparison: None.

CLINICAL DATA: Preop, prostate cancer

EXAM:
CHEST  2 VIEW

[w chest pa]
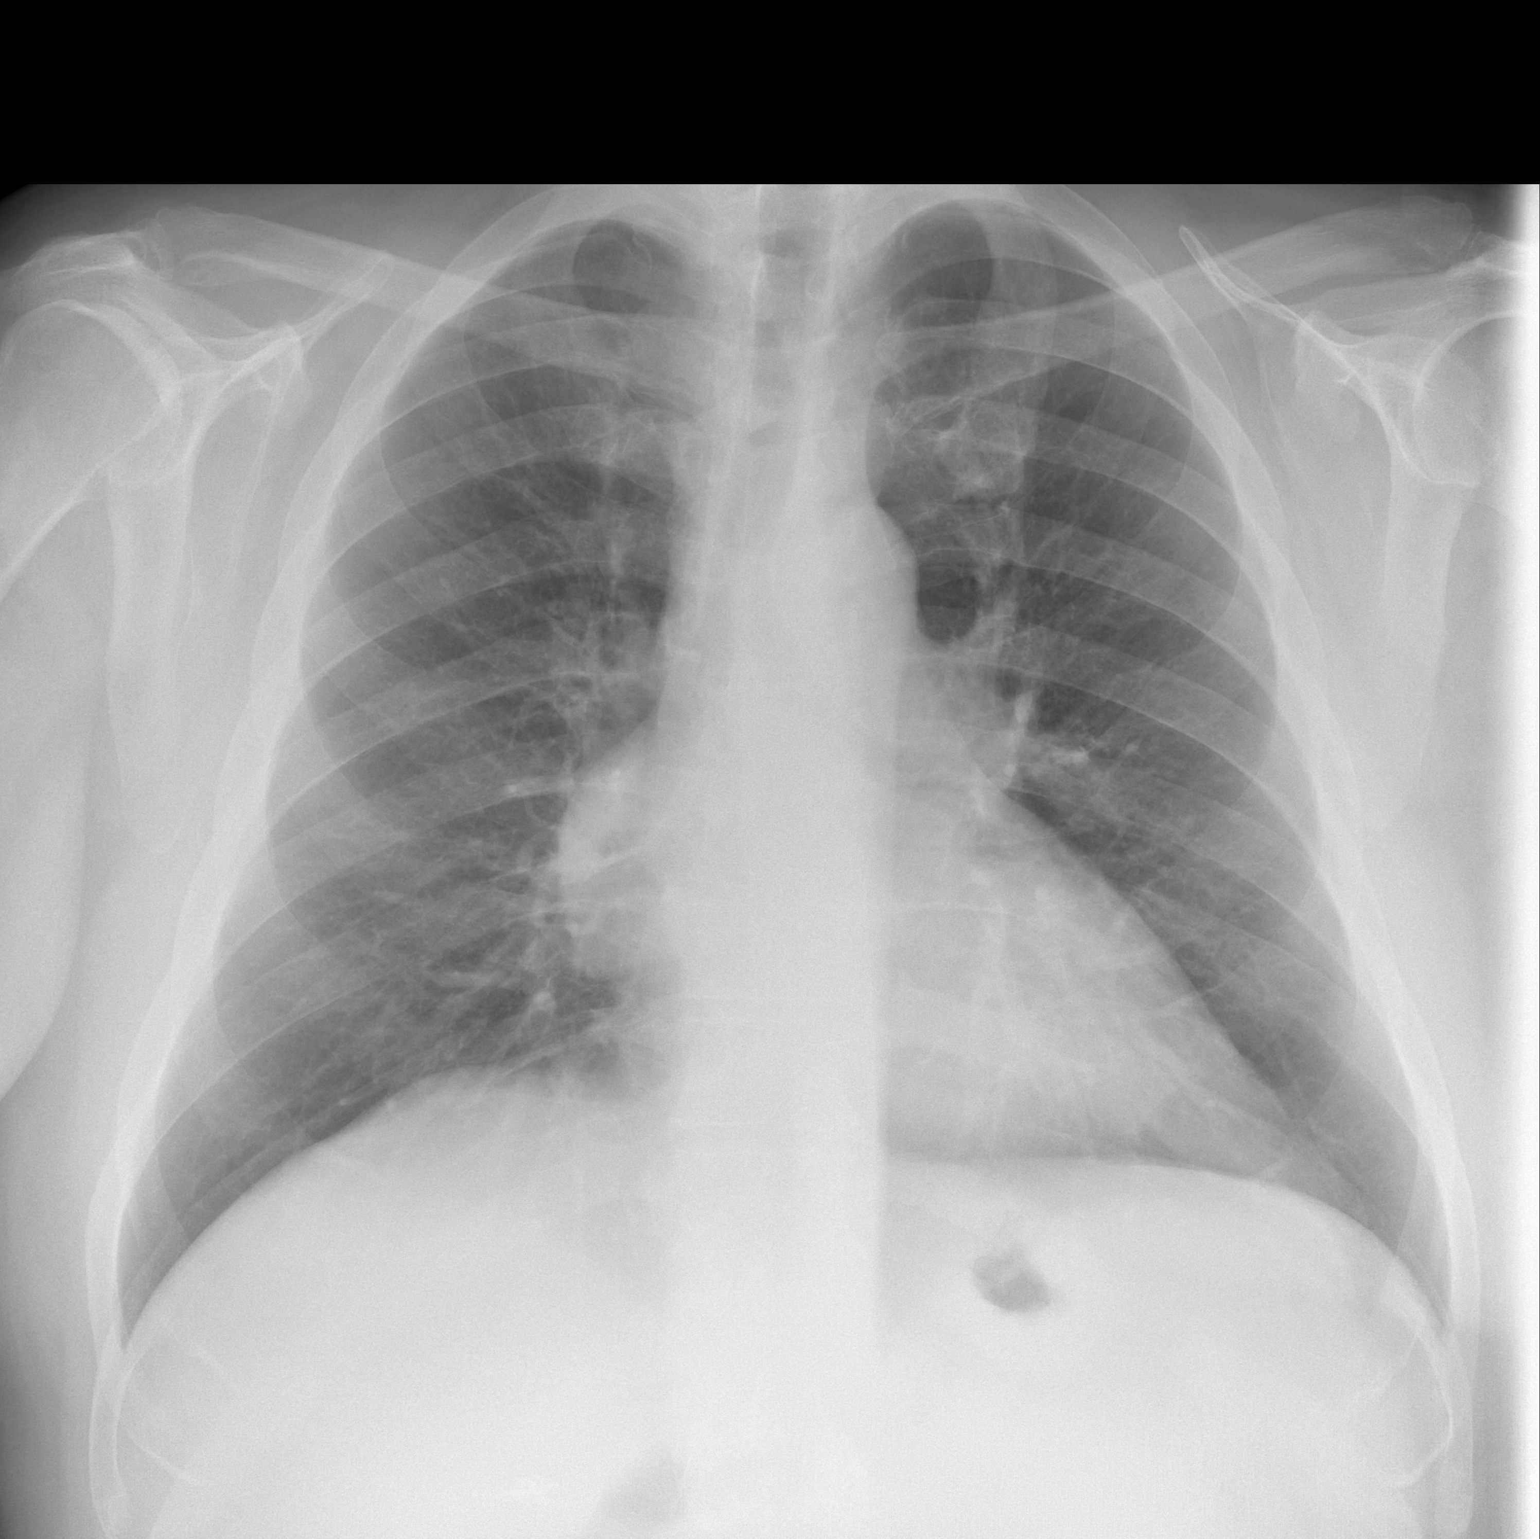

[w chest lat]
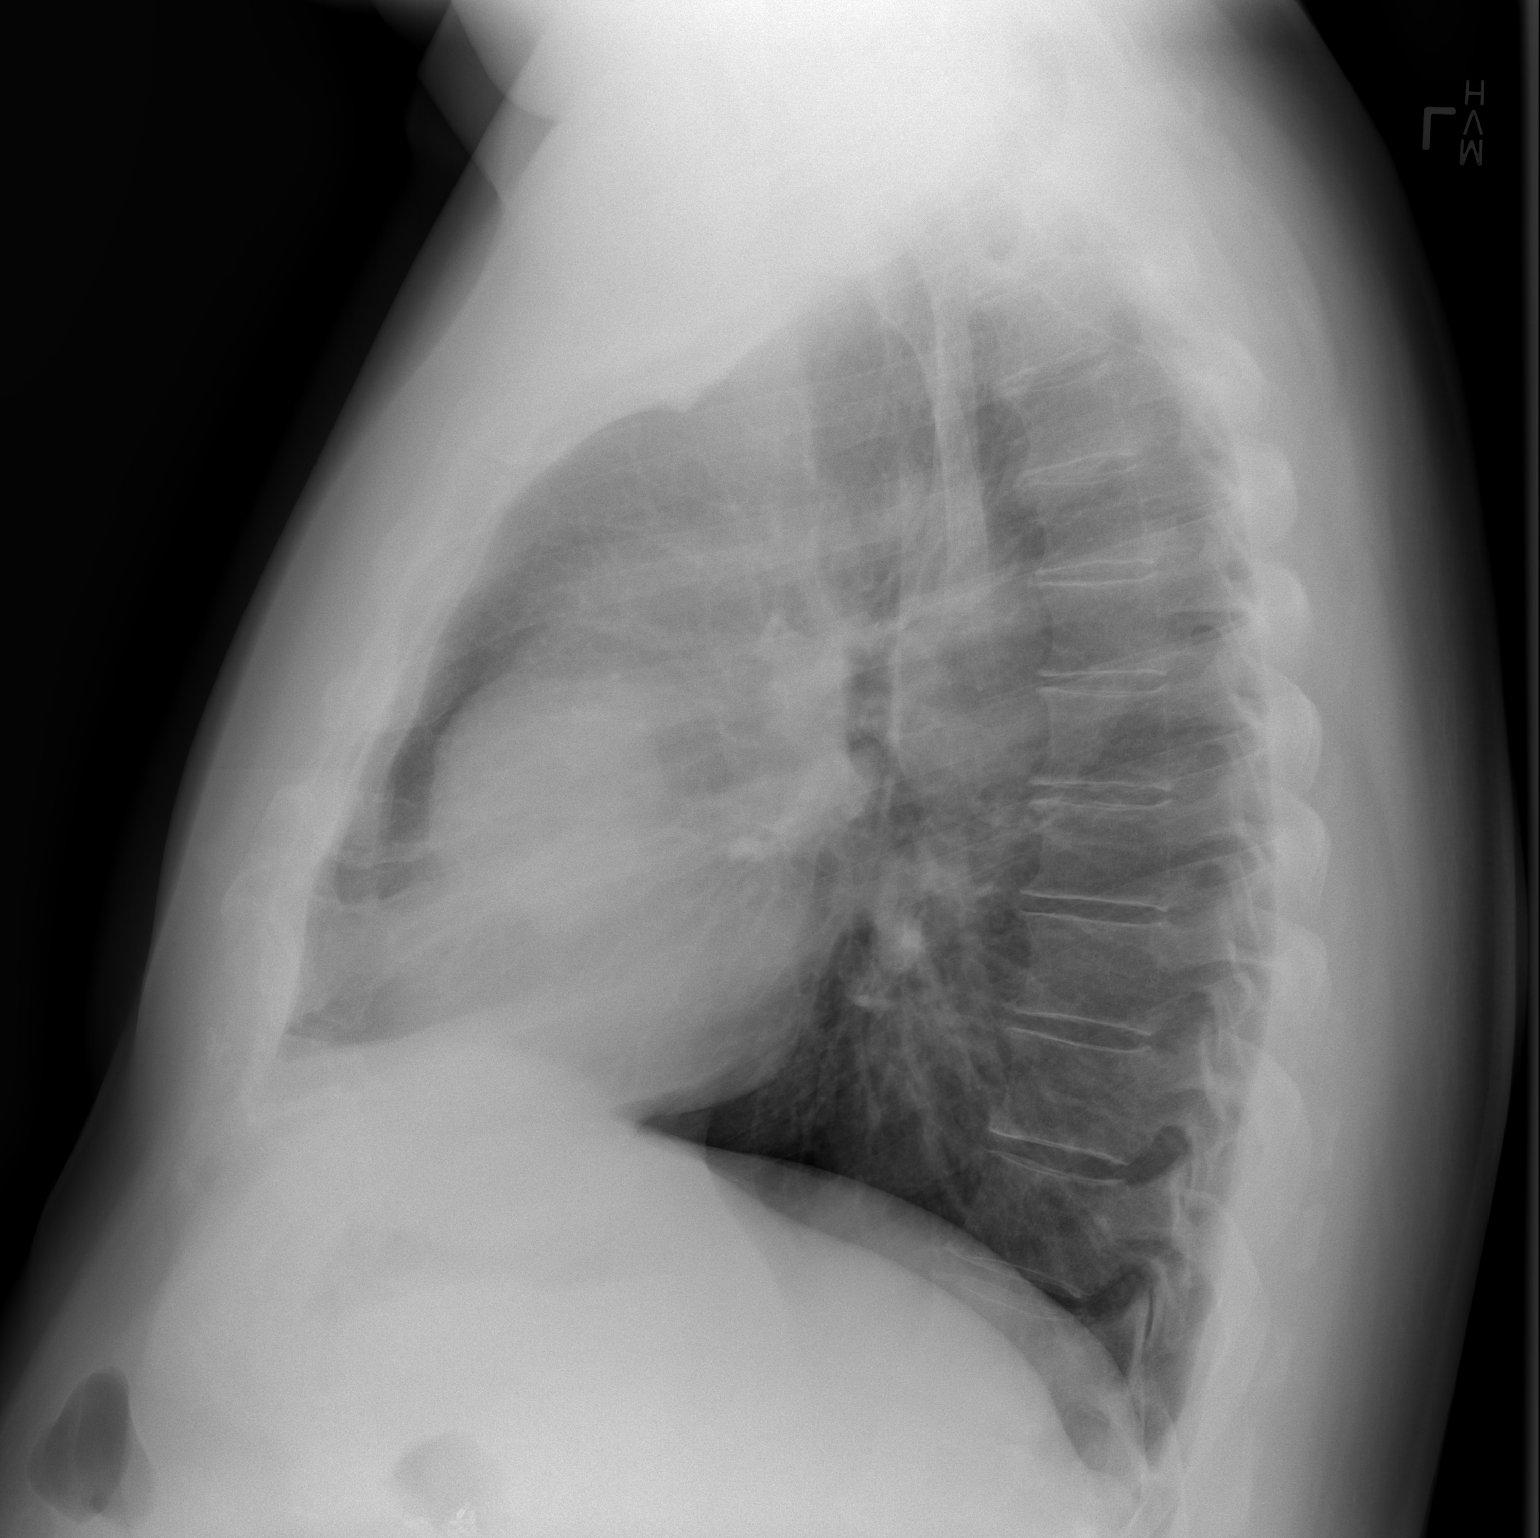

[2 of 2 positions shown; findings below may reference images not displayed]

FINDINGS: Cardiac size is unremarkable. There is nodular prominence in right
infrahilar region silhouetting the right heart border. Mass or
adenopathy cannot be excluded. Further correlation with CT scan of
the chest with IV contrast is recommended. No infiltrate or pleural
effusion. No pulmonary edema. Mild degenerative changes mid thoracic
spine.
IMPRESSION: There is nodular prominence in right infrahilar region silhouetting
the right heart border. Mass or adenopathy cannot be excluded.
Further correlation with CT scan of the chest with IV contrast is
recommended. No infiltrate or pleural effusion. No pulmonary edema.

These results were called by telephone at the time of interpretation
on 06/01/2016 at [DATE] to Dr. PHATHUTSHEDZO SOLANI , who verbally
acknowledged these results.

## 2018-07-08 DIAGNOSIS — H8113 Benign paroxysmal vertigo, bilateral: Secondary | ICD-10-CM | POA: Diagnosis not present

## 2018-07-16 IMAGING — CR DG ORBITS FOR FOREIGN BODY
2 series · 2 of 2 positions shown · non-contrast
Comparison: None.

CLINICAL DATA: Metal working/exposure; clearance prior to MRI

EXAM:
ORBITS FOR FOREIGN BODY - 2 VIEW

[w orbit pa (1 of 2)]
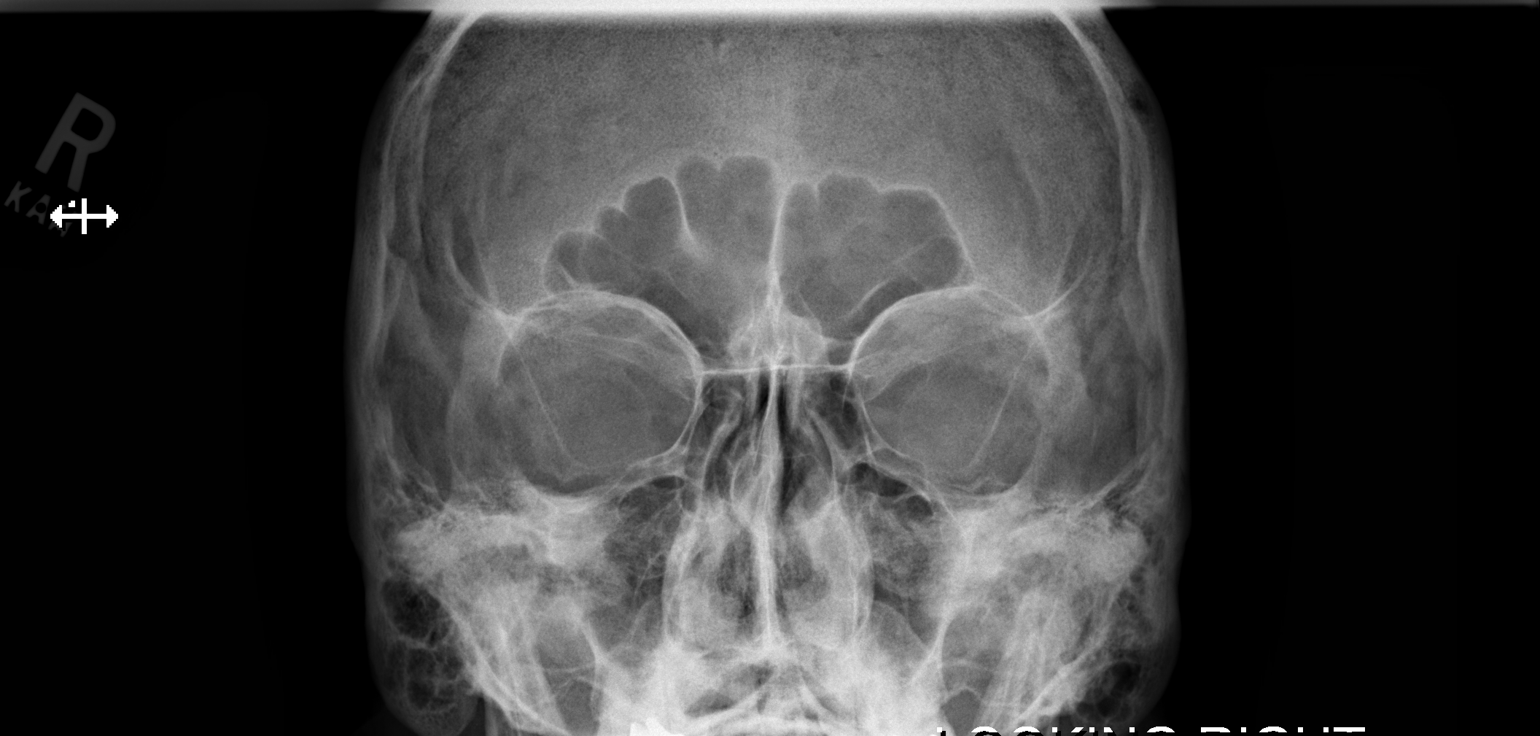

[w orbit pa (2 of 2)]
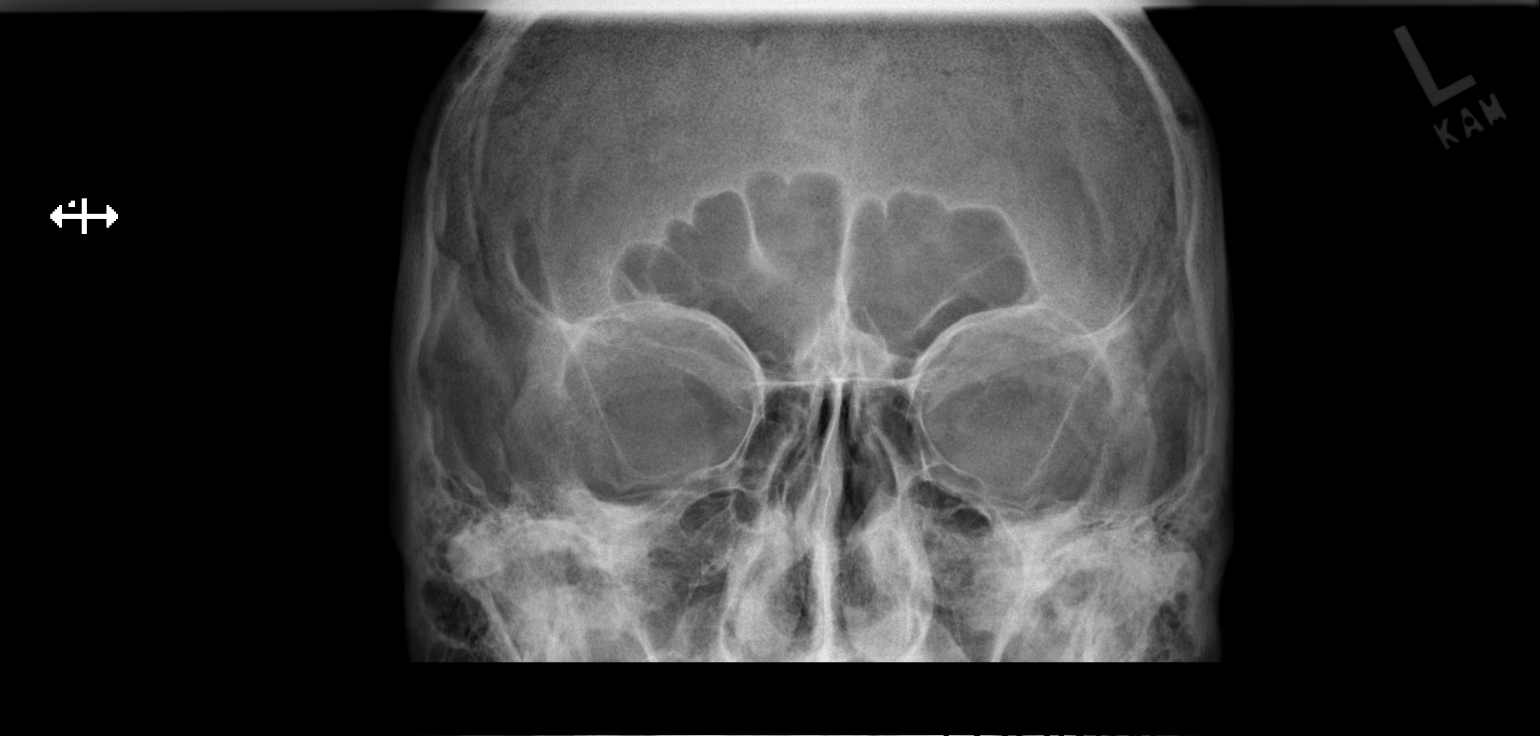

[2 of 2 positions shown; findings below may reference images not displayed]

FINDINGS: There is no evidence of metallic foreign body within the orbits. No
significant bone abnormality identified.
IMPRESSION: No evidence of metallic foreign body within the orbits.

## 2018-07-18 DIAGNOSIS — D492 Neoplasm of unspecified behavior of bone, soft tissue, and skin: Secondary | ICD-10-CM | POA: Diagnosis not present

## 2018-07-18 DIAGNOSIS — Z0000 Encounter for general adult medical examination without abnormal findings: Secondary | ICD-10-CM | POA: Diagnosis not present

## 2018-07-18 DIAGNOSIS — L919 Hypertrophic disorder of the skin, unspecified: Secondary | ICD-10-CM | POA: Diagnosis not present

## 2018-07-18 DIAGNOSIS — E559 Vitamin D deficiency, unspecified: Secondary | ICD-10-CM | POA: Diagnosis not present

## 2018-07-18 DIAGNOSIS — Z1389 Encounter for screening for other disorder: Secondary | ICD-10-CM | POA: Diagnosis not present

## 2018-07-18 DIAGNOSIS — E669 Obesity, unspecified: Secondary | ICD-10-CM | POA: Diagnosis not present

## 2018-07-18 DIAGNOSIS — Z6839 Body mass index (BMI) 39.0-39.9, adult: Secondary | ICD-10-CM | POA: Diagnosis not present

## 2018-07-18 DIAGNOSIS — B079 Viral wart, unspecified: Secondary | ICD-10-CM | POA: Diagnosis not present

## 2018-08-20 DIAGNOSIS — B354 Tinea corporis: Secondary | ICD-10-CM | POA: Diagnosis not present

## 2018-09-30 DIAGNOSIS — Z23 Encounter for immunization: Secondary | ICD-10-CM | POA: Diagnosis not present

## 2018-10-08 DIAGNOSIS — M25561 Pain in right knee: Secondary | ICD-10-CM | POA: Diagnosis not present

## 2018-10-08 DIAGNOSIS — G8929 Other chronic pain: Secondary | ICD-10-CM | POA: Diagnosis not present

## 2018-10-08 DIAGNOSIS — M25562 Pain in left knee: Secondary | ICD-10-CM | POA: Diagnosis not present

## 2018-10-08 DIAGNOSIS — M170 Bilateral primary osteoarthritis of knee: Secondary | ICD-10-CM | POA: Diagnosis not present

## 2018-10-27 IMAGING — CR DG CHEST 2V
2 series · 2 of 2 positions shown · non-contrast
Comparison: 09/18/2016.  MRI [DATE].

CLINICAL DATA: Mediastinal mass.

EXAM:
CHEST  2 VIEW

[w chest lat]
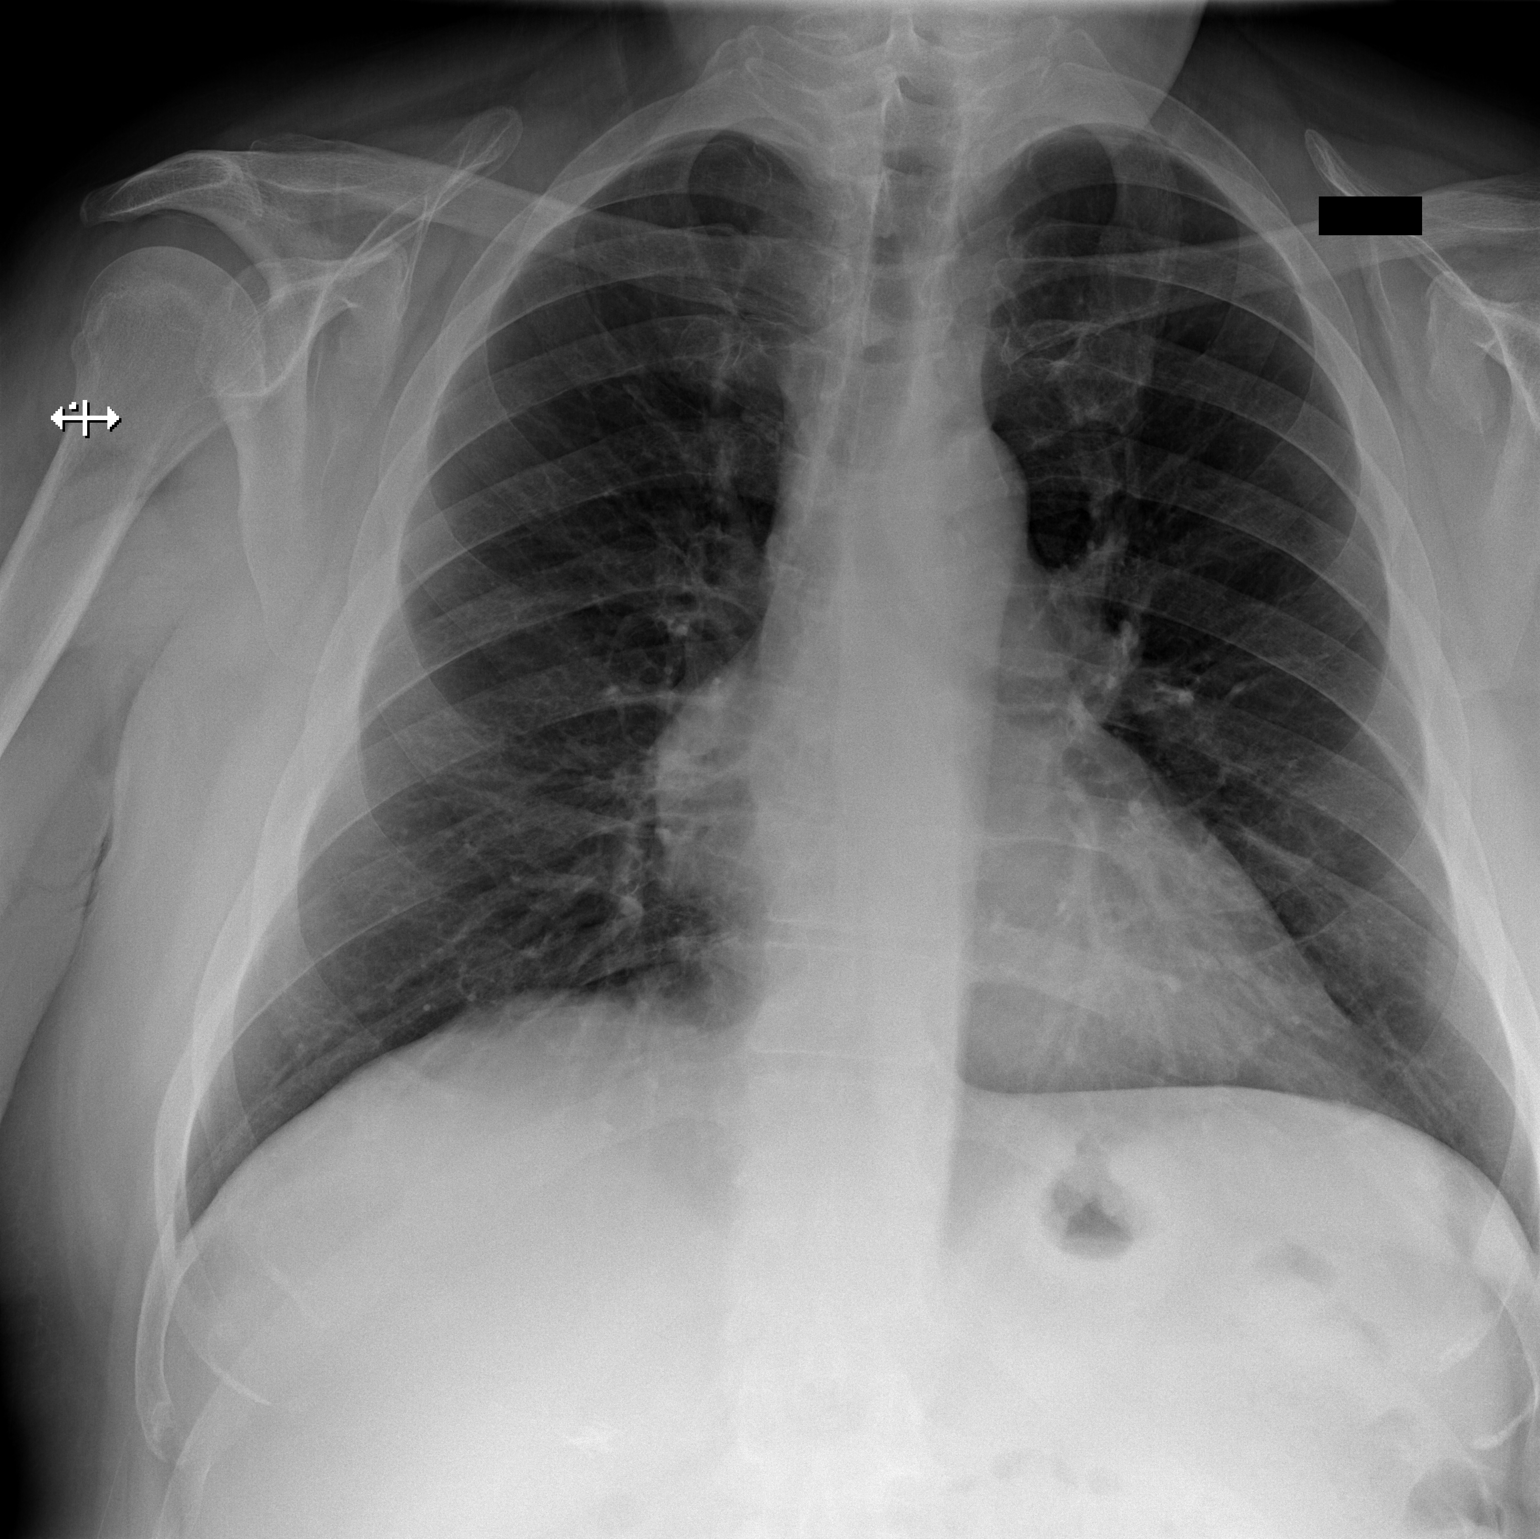

[w chest decub]
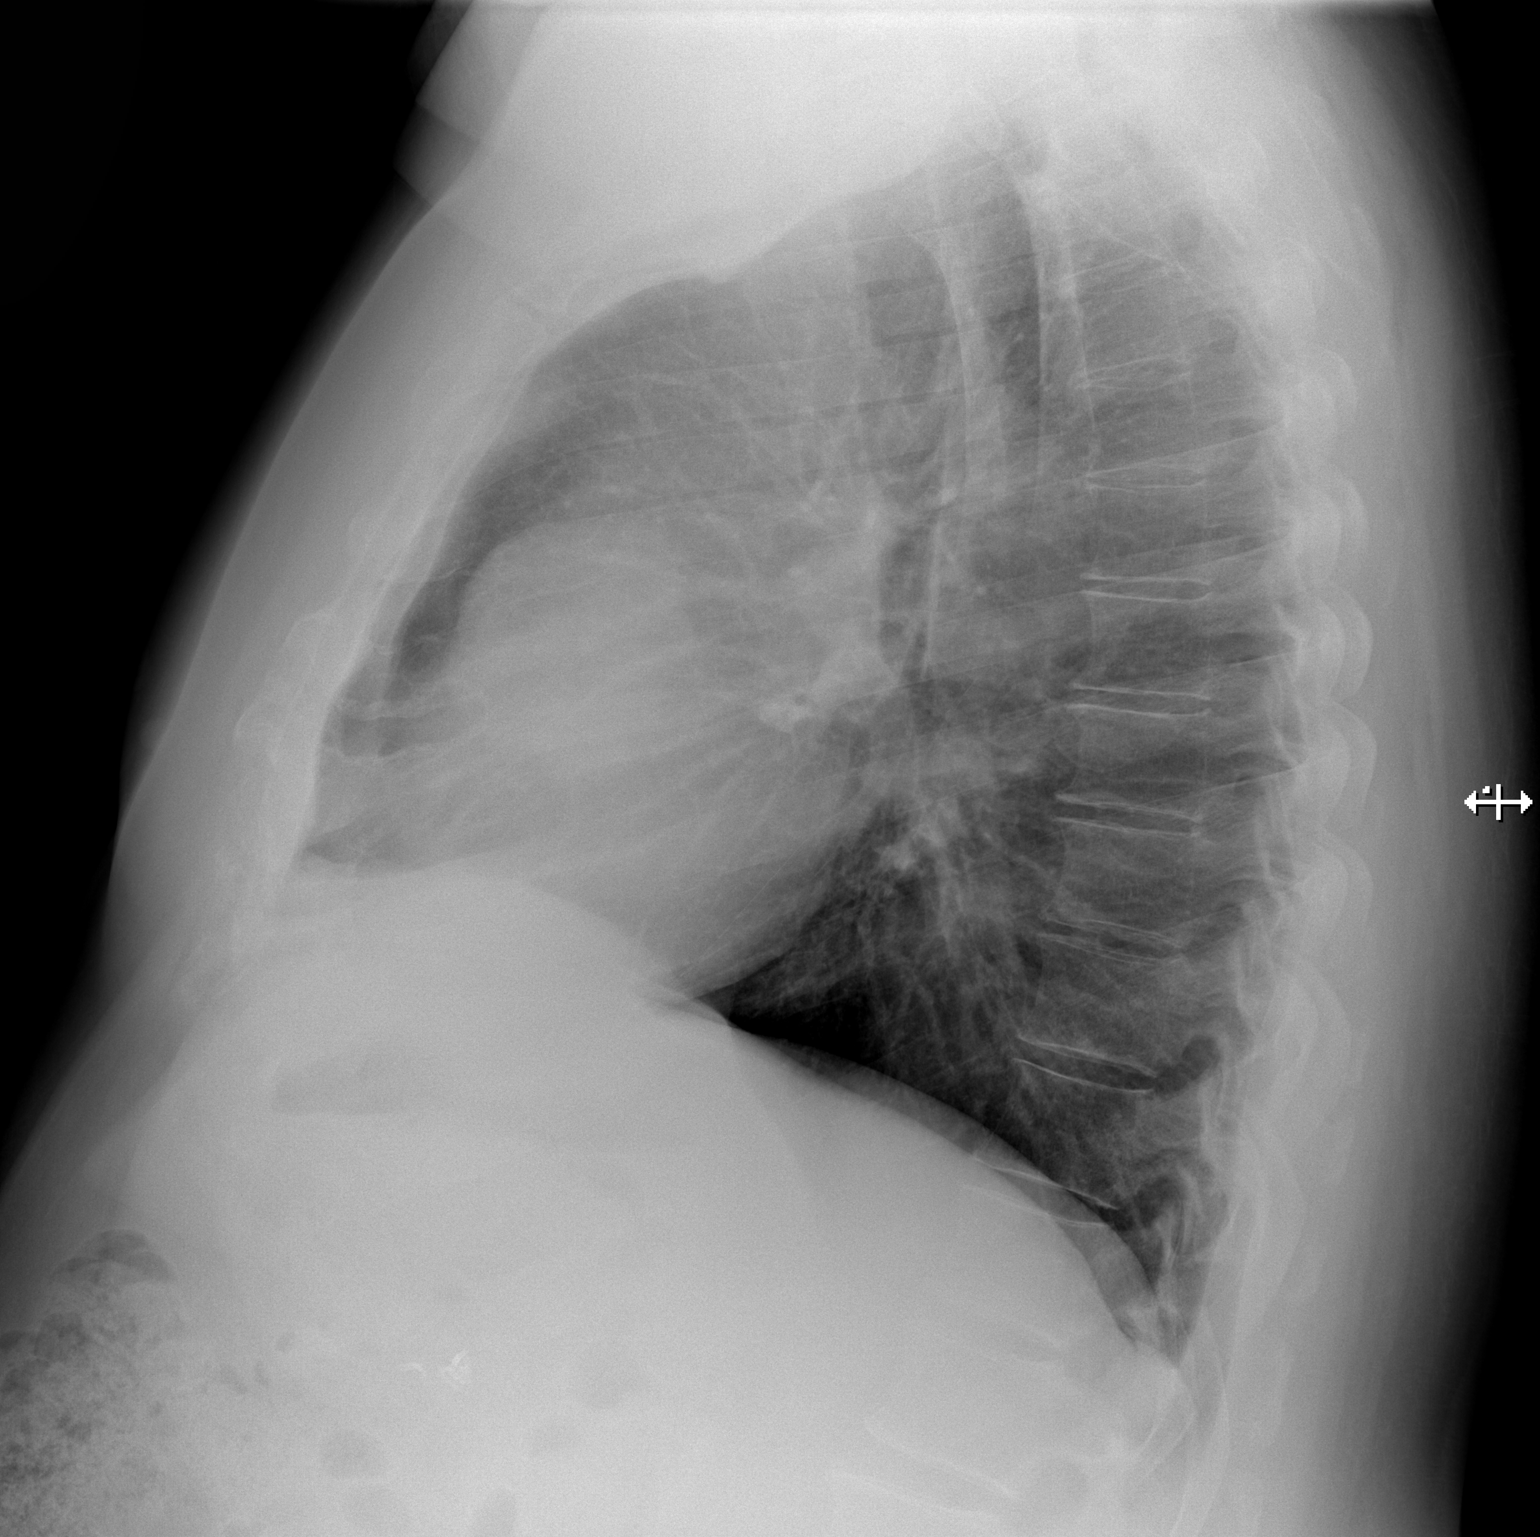

[2 of 2 positions shown; findings below may reference images not displayed]

FINDINGS: Persistent right pericardial mass, no change. Heart size normal. No
pulmonary infiltrate. No pleural effusion or pneumothorax .
IMPRESSION: Persistent right pericardial mass. Reference is made to prior MRI
report 07/12/2016. No acute cardiopulmonary disease.

## 2018-11-04 DIAGNOSIS — B078 Other viral warts: Secondary | ICD-10-CM | POA: Diagnosis not present

## 2018-11-04 DIAGNOSIS — L918 Other hypertrophic disorders of the skin: Secondary | ICD-10-CM | POA: Diagnosis not present

## 2018-12-02 DIAGNOSIS — L918 Other hypertrophic disorders of the skin: Secondary | ICD-10-CM | POA: Diagnosis not present

## 2018-12-10 DIAGNOSIS — Z8546 Personal history of malignant neoplasm of prostate: Secondary | ICD-10-CM | POA: Diagnosis not present

## 2018-12-10 DIAGNOSIS — E291 Testicular hypofunction: Secondary | ICD-10-CM | POA: Diagnosis not present

## 2019-01-31 DIAGNOSIS — I10 Essential (primary) hypertension: Secondary | ICD-10-CM | POA: Diagnosis not present

## 2019-01-31 DIAGNOSIS — Z6841 Body Mass Index (BMI) 40.0 and over, adult: Secondary | ICD-10-CM | POA: Diagnosis not present

## 2019-03-19 DIAGNOSIS — I10 Essential (primary) hypertension: Secondary | ICD-10-CM | POA: Diagnosis not present

## 2019-05-23 DIAGNOSIS — Z23 Encounter for immunization: Secondary | ICD-10-CM | POA: Diagnosis not present

## 2019-05-27 DIAGNOSIS — Z8546 Personal history of malignant neoplasm of prostate: Secondary | ICD-10-CM | POA: Diagnosis not present

## 2019-05-27 DIAGNOSIS — E291 Testicular hypofunction: Secondary | ICD-10-CM | POA: Diagnosis not present

## 2019-06-03 DIAGNOSIS — N400 Enlarged prostate without lower urinary tract symptoms: Secondary | ICD-10-CM | POA: Diagnosis not present

## 2019-06-03 DIAGNOSIS — Z8546 Personal history of malignant neoplasm of prostate: Secondary | ICD-10-CM | POA: Diagnosis not present

## 2019-06-03 DIAGNOSIS — N5201 Erectile dysfunction due to arterial insufficiency: Secondary | ICD-10-CM | POA: Diagnosis not present

## 2019-10-17 DIAGNOSIS — Z23 Encounter for immunization: Secondary | ICD-10-CM | POA: Diagnosis not present

## 2019-11-14 DIAGNOSIS — I10 Essential (primary) hypertension: Secondary | ICD-10-CM | POA: Diagnosis not present

## 2019-11-14 DIAGNOSIS — Z6841 Body Mass Index (BMI) 40.0 and over, adult: Secondary | ICD-10-CM | POA: Diagnosis not present

## 2020-10-06 DIAGNOSIS — Z23 Encounter for immunization: Secondary | ICD-10-CM | POA: Diagnosis not present

## 2020-11-22 DIAGNOSIS — E669 Obesity, unspecified: Secondary | ICD-10-CM | POA: Diagnosis not present

## 2020-11-22 DIAGNOSIS — Z125 Encounter for screening for malignant neoplasm of prostate: Secondary | ICD-10-CM | POA: Diagnosis not present

## 2020-11-22 DIAGNOSIS — R7301 Impaired fasting glucose: Secondary | ICD-10-CM | POA: Diagnosis not present

## 2020-11-22 DIAGNOSIS — Z0000 Encounter for general adult medical examination without abnormal findings: Secondary | ICD-10-CM | POA: Diagnosis not present

## 2020-11-22 DIAGNOSIS — Z23 Encounter for immunization: Secondary | ICD-10-CM | POA: Diagnosis not present

## 2020-11-22 DIAGNOSIS — Z8546 Personal history of malignant neoplasm of prostate: Secondary | ICD-10-CM | POA: Diagnosis not present

## 2020-11-22 DIAGNOSIS — K219 Gastro-esophageal reflux disease without esophagitis: Secondary | ICD-10-CM | POA: Diagnosis not present

## 2020-11-22 DIAGNOSIS — I10 Essential (primary) hypertension: Secondary | ICD-10-CM | POA: Diagnosis not present

## 2020-11-22 DIAGNOSIS — Z6839 Body mass index (BMI) 39.0-39.9, adult: Secondary | ICD-10-CM | POA: Diagnosis not present

## 2021-01-12 DIAGNOSIS — S61412A Laceration without foreign body of left hand, initial encounter: Secondary | ICD-10-CM | POA: Diagnosis not present

## 2021-02-17 DIAGNOSIS — Z800 Family history of malignant neoplasm of digestive organs: Secondary | ICD-10-CM | POA: Diagnosis not present

## 2021-02-17 DIAGNOSIS — D126 Benign neoplasm of colon, unspecified: Secondary | ICD-10-CM | POA: Diagnosis not present

## 2021-02-26 DIAGNOSIS — R059 Cough, unspecified: Secondary | ICD-10-CM | POA: Diagnosis not present

## 2021-02-26 DIAGNOSIS — J309 Allergic rhinitis, unspecified: Secondary | ICD-10-CM | POA: Diagnosis not present

## 2021-02-26 DIAGNOSIS — J9801 Acute bronchospasm: Secondary | ICD-10-CM | POA: Diagnosis not present

## 2021-03-04 DIAGNOSIS — I10 Essential (primary) hypertension: Secondary | ICD-10-CM | POA: Diagnosis not present

## 2021-03-04 DIAGNOSIS — Z09 Encounter for follow-up examination after completed treatment for conditions other than malignant neoplasm: Secondary | ICD-10-CM | POA: Diagnosis not present

## 2021-03-04 DIAGNOSIS — K648 Other hemorrhoids: Secondary | ICD-10-CM | POA: Diagnosis not present

## 2021-03-04 DIAGNOSIS — Z8546 Personal history of malignant neoplasm of prostate: Secondary | ICD-10-CM | POA: Diagnosis not present

## 2021-03-04 DIAGNOSIS — Z86010 Personal history of colonic polyps: Secondary | ICD-10-CM | POA: Diagnosis not present

## 2021-03-04 DIAGNOSIS — Z800 Family history of malignant neoplasm of digestive organs: Secondary | ICD-10-CM | POA: Diagnosis not present

## 2021-05-17 DIAGNOSIS — R5383 Other fatigue: Secondary | ICD-10-CM | POA: Diagnosis not present

## 2021-05-17 DIAGNOSIS — N451 Epididymitis: Secondary | ICD-10-CM | POA: Diagnosis not present

## 2021-05-17 DIAGNOSIS — K219 Gastro-esophageal reflux disease without esophagitis: Secondary | ICD-10-CM | POA: Diagnosis not present

## 2021-05-17 DIAGNOSIS — I10 Essential (primary) hypertension: Secondary | ICD-10-CM | POA: Diagnosis not present

## 2021-05-17 DIAGNOSIS — E559 Vitamin D deficiency, unspecified: Secondary | ICD-10-CM | POA: Diagnosis not present

## 2021-05-17 DIAGNOSIS — D519 Vitamin B12 deficiency anemia, unspecified: Secondary | ICD-10-CM | POA: Diagnosis not present

## 2021-05-17 DIAGNOSIS — Z6841 Body Mass Index (BMI) 40.0 and over, adult: Secondary | ICD-10-CM | POA: Diagnosis not present

## 2021-05-25 DIAGNOSIS — E291 Testicular hypofunction: Secondary | ICD-10-CM | POA: Diagnosis not present

## 2021-06-08 DIAGNOSIS — E291 Testicular hypofunction: Secondary | ICD-10-CM | POA: Diagnosis not present

## 2021-06-22 DIAGNOSIS — E291 Testicular hypofunction: Secondary | ICD-10-CM | POA: Diagnosis not present

## 2021-06-22 DIAGNOSIS — Z8546 Personal history of malignant neoplasm of prostate: Secondary | ICD-10-CM | POA: Diagnosis not present

## 2021-07-06 DIAGNOSIS — E291 Testicular hypofunction: Secondary | ICD-10-CM | POA: Diagnosis not present

## 2021-07-20 DIAGNOSIS — E291 Testicular hypofunction: Secondary | ICD-10-CM | POA: Diagnosis not present

## 2021-08-03 DIAGNOSIS — E291 Testicular hypofunction: Secondary | ICD-10-CM | POA: Diagnosis not present

## 2021-08-15 DIAGNOSIS — H524 Presbyopia: Secondary | ICD-10-CM | POA: Diagnosis not present

## 2021-08-16 DIAGNOSIS — E291 Testicular hypofunction: Secondary | ICD-10-CM | POA: Diagnosis not present

## 2021-08-16 DIAGNOSIS — Z6839 Body mass index (BMI) 39.0-39.9, adult: Secondary | ICD-10-CM | POA: Diagnosis not present

## 2021-08-16 DIAGNOSIS — Z0000 Encounter for general adult medical examination without abnormal findings: Secondary | ICD-10-CM | POA: Diagnosis not present

## 2021-08-16 DIAGNOSIS — K219 Gastro-esophageal reflux disease without esophagitis: Secondary | ICD-10-CM | POA: Diagnosis not present

## 2021-08-16 DIAGNOSIS — C61 Malignant neoplasm of prostate: Secondary | ICD-10-CM | POA: Diagnosis not present

## 2021-08-16 DIAGNOSIS — I10 Essential (primary) hypertension: Secondary | ICD-10-CM | POA: Diagnosis not present

## 2021-08-26 DIAGNOSIS — R051 Acute cough: Secondary | ICD-10-CM | POA: Diagnosis not present

## 2021-08-26 DIAGNOSIS — J22 Unspecified acute lower respiratory infection: Secondary | ICD-10-CM | POA: Diagnosis not present

## 2021-08-30 DIAGNOSIS — E291 Testicular hypofunction: Secondary | ICD-10-CM | POA: Diagnosis not present

## 2021-09-21 DIAGNOSIS — E291 Testicular hypofunction: Secondary | ICD-10-CM | POA: Diagnosis not present

## 2021-09-21 DIAGNOSIS — Z23 Encounter for immunization: Secondary | ICD-10-CM | POA: Diagnosis not present

## 2021-10-05 DIAGNOSIS — E291 Testicular hypofunction: Secondary | ICD-10-CM | POA: Diagnosis not present

## 2021-10-13 DIAGNOSIS — R42 Dizziness and giddiness: Secondary | ICD-10-CM | POA: Diagnosis not present

## 2021-10-13 DIAGNOSIS — J0100 Acute maxillary sinusitis, unspecified: Secondary | ICD-10-CM | POA: Diagnosis not present

## 2021-10-19 DIAGNOSIS — E291 Testicular hypofunction: Secondary | ICD-10-CM | POA: Diagnosis not present

## 2021-11-02 DIAGNOSIS — E291 Testicular hypofunction: Secondary | ICD-10-CM | POA: Diagnosis not present

## 2021-11-10 DIAGNOSIS — K219 Gastro-esophageal reflux disease without esophagitis: Secondary | ICD-10-CM | POA: Diagnosis not present

## 2021-11-10 DIAGNOSIS — Z6839 Body mass index (BMI) 39.0-39.9, adult: Secondary | ICD-10-CM | POA: Diagnosis not present

## 2021-11-10 DIAGNOSIS — I10 Essential (primary) hypertension: Secondary | ICD-10-CM | POA: Diagnosis not present

## 2021-11-10 DIAGNOSIS — E291 Testicular hypofunction: Secondary | ICD-10-CM | POA: Diagnosis not present

## 2021-11-10 DIAGNOSIS — Z8546 Personal history of malignant neoplasm of prostate: Secondary | ICD-10-CM | POA: Diagnosis not present

## 2021-11-16 DIAGNOSIS — E291 Testicular hypofunction: Secondary | ICD-10-CM | POA: Diagnosis not present

## 2021-11-30 DIAGNOSIS — E291 Testicular hypofunction: Secondary | ICD-10-CM | POA: Diagnosis not present

## 2021-12-14 DIAGNOSIS — E291 Testicular hypofunction: Secondary | ICD-10-CM | POA: Diagnosis not present

## 2021-12-28 DIAGNOSIS — E291 Testicular hypofunction: Secondary | ICD-10-CM | POA: Diagnosis not present

## 2022-01-11 DIAGNOSIS — E291 Testicular hypofunction: Secondary | ICD-10-CM | POA: Diagnosis not present

## 2022-01-25 DIAGNOSIS — E291 Testicular hypofunction: Secondary | ICD-10-CM | POA: Diagnosis not present

## 2022-02-08 DIAGNOSIS — E291 Testicular hypofunction: Secondary | ICD-10-CM | POA: Diagnosis not present

## 2022-02-13 DIAGNOSIS — K219 Gastro-esophageal reflux disease without esophagitis: Secondary | ICD-10-CM | POA: Diagnosis not present

## 2022-02-13 DIAGNOSIS — E291 Testicular hypofunction: Secondary | ICD-10-CM | POA: Diagnosis not present

## 2022-02-13 DIAGNOSIS — I10 Essential (primary) hypertension: Secondary | ICD-10-CM | POA: Diagnosis not present

## 2022-02-13 DIAGNOSIS — Z8546 Personal history of malignant neoplasm of prostate: Secondary | ICD-10-CM | POA: Diagnosis not present

## 2022-02-13 DIAGNOSIS — Z79899 Other long term (current) drug therapy: Secondary | ICD-10-CM | POA: Diagnosis not present

## 2022-02-13 DIAGNOSIS — Z125 Encounter for screening for malignant neoplasm of prostate: Secondary | ICD-10-CM | POA: Diagnosis not present

## 2022-02-13 DIAGNOSIS — Z6839 Body mass index (BMI) 39.0-39.9, adult: Secondary | ICD-10-CM | POA: Diagnosis not present

## 2022-02-21 DIAGNOSIS — E291 Testicular hypofunction: Secondary | ICD-10-CM | POA: Diagnosis not present

## 2022-03-07 DIAGNOSIS — E291 Testicular hypofunction: Secondary | ICD-10-CM | POA: Diagnosis not present

## 2022-03-21 DIAGNOSIS — E291 Testicular hypofunction: Secondary | ICD-10-CM | POA: Diagnosis not present

## 2022-04-04 DIAGNOSIS — E291 Testicular hypofunction: Secondary | ICD-10-CM | POA: Diagnosis not present

## 2022-04-18 DIAGNOSIS — E291 Testicular hypofunction: Secondary | ICD-10-CM | POA: Diagnosis not present

## 2022-05-02 DIAGNOSIS — E291 Testicular hypofunction: Secondary | ICD-10-CM | POA: Diagnosis not present

## 2022-05-16 DIAGNOSIS — E291 Testicular hypofunction: Secondary | ICD-10-CM | POA: Diagnosis not present

## 2022-05-30 DIAGNOSIS — E291 Testicular hypofunction: Secondary | ICD-10-CM | POA: Diagnosis not present

## 2022-06-13 DIAGNOSIS — E291 Testicular hypofunction: Secondary | ICD-10-CM | POA: Diagnosis not present

## 2022-06-27 DIAGNOSIS — E291 Testicular hypofunction: Secondary | ICD-10-CM | POA: Diagnosis not present

## 2022-07-11 DIAGNOSIS — E291 Testicular hypofunction: Secondary | ICD-10-CM | POA: Diagnosis not present

## 2022-07-25 DIAGNOSIS — E291 Testicular hypofunction: Secondary | ICD-10-CM | POA: Diagnosis not present

## 2022-08-11 DIAGNOSIS — E291 Testicular hypofunction: Secondary | ICD-10-CM | POA: Diagnosis not present

## 2022-08-17 DIAGNOSIS — J22 Unspecified acute lower respiratory infection: Secondary | ICD-10-CM | POA: Diagnosis not present

## 2022-08-17 DIAGNOSIS — U071 COVID-19: Secondary | ICD-10-CM | POA: Diagnosis not present

## 2022-08-24 DIAGNOSIS — E291 Testicular hypofunction: Secondary | ICD-10-CM | POA: Diagnosis not present

## 2022-09-06 DIAGNOSIS — R21 Rash and other nonspecific skin eruption: Secondary | ICD-10-CM | POA: Diagnosis not present

## 2022-09-06 DIAGNOSIS — E291 Testicular hypofunction: Secondary | ICD-10-CM | POA: Diagnosis not present

## 2022-09-19 DIAGNOSIS — Z23 Encounter for immunization: Secondary | ICD-10-CM | POA: Diagnosis not present

## 2022-09-19 DIAGNOSIS — E291 Testicular hypofunction: Secondary | ICD-10-CM | POA: Diagnosis not present

## 2022-10-02 DIAGNOSIS — I10 Essential (primary) hypertension: Secondary | ICD-10-CM | POA: Diagnosis not present

## 2022-10-02 DIAGNOSIS — E291 Testicular hypofunction: Secondary | ICD-10-CM | POA: Diagnosis not present

## 2022-10-03 DIAGNOSIS — K219 Gastro-esophageal reflux disease without esophagitis: Secondary | ICD-10-CM | POA: Diagnosis not present

## 2022-10-03 DIAGNOSIS — Z6838 Body mass index (BMI) 38.0-38.9, adult: Secondary | ICD-10-CM | POA: Diagnosis not present

## 2022-10-03 DIAGNOSIS — E669 Obesity, unspecified: Secondary | ICD-10-CM | POA: Diagnosis not present

## 2022-10-03 DIAGNOSIS — E291 Testicular hypofunction: Secondary | ICD-10-CM | POA: Diagnosis not present

## 2022-10-03 DIAGNOSIS — I10 Essential (primary) hypertension: Secondary | ICD-10-CM | POA: Diagnosis not present

## 2022-10-17 DIAGNOSIS — J0100 Acute maxillary sinusitis, unspecified: Secondary | ICD-10-CM | POA: Diagnosis not present

## 2022-10-17 DIAGNOSIS — E291 Testicular hypofunction: Secondary | ICD-10-CM | POA: Diagnosis not present

## 2022-10-31 DIAGNOSIS — E291 Testicular hypofunction: Secondary | ICD-10-CM | POA: Diagnosis not present

## 2022-11-13 DIAGNOSIS — E291 Testicular hypofunction: Secondary | ICD-10-CM | POA: Diagnosis not present

## 2022-11-13 DIAGNOSIS — Z8546 Personal history of malignant neoplasm of prostate: Secondary | ICD-10-CM | POA: Diagnosis not present

## 2022-11-13 DIAGNOSIS — K219 Gastro-esophageal reflux disease without esophagitis: Secondary | ICD-10-CM | POA: Diagnosis not present

## 2022-11-13 DIAGNOSIS — Z0000 Encounter for general adult medical examination without abnormal findings: Secondary | ICD-10-CM | POA: Diagnosis not present

## 2022-11-13 DIAGNOSIS — C61 Malignant neoplasm of prostate: Secondary | ICD-10-CM | POA: Diagnosis not present

## 2022-11-13 DIAGNOSIS — Z1389 Encounter for screening for other disorder: Secondary | ICD-10-CM | POA: Diagnosis not present

## 2022-11-13 DIAGNOSIS — E669 Obesity, unspecified: Secondary | ICD-10-CM | POA: Diagnosis not present

## 2022-11-13 DIAGNOSIS — Z6838 Body mass index (BMI) 38.0-38.9, adult: Secondary | ICD-10-CM | POA: Diagnosis not present

## 2022-11-13 DIAGNOSIS — I10 Essential (primary) hypertension: Secondary | ICD-10-CM | POA: Diagnosis not present

## 2022-11-14 DIAGNOSIS — E291 Testicular hypofunction: Secondary | ICD-10-CM | POA: Diagnosis not present

## 2022-11-14 DIAGNOSIS — Z23 Encounter for immunization: Secondary | ICD-10-CM | POA: Diagnosis not present

## 2022-11-29 DIAGNOSIS — E291 Testicular hypofunction: Secondary | ICD-10-CM | POA: Diagnosis not present

## 2022-12-07 DIAGNOSIS — F411 Generalized anxiety disorder: Secondary | ICD-10-CM | POA: Diagnosis not present

## 2022-12-12 DIAGNOSIS — E291 Testicular hypofunction: Secondary | ICD-10-CM | POA: Diagnosis not present

## 2022-12-26 DIAGNOSIS — E291 Testicular hypofunction: Secondary | ICD-10-CM | POA: Diagnosis not present

## 2023-01-04 DIAGNOSIS — F411 Generalized anxiety disorder: Secondary | ICD-10-CM | POA: Diagnosis not present

## 2023-01-09 DIAGNOSIS — E291 Testicular hypofunction: Secondary | ICD-10-CM | POA: Diagnosis not present

## 2023-01-23 DIAGNOSIS — E291 Testicular hypofunction: Secondary | ICD-10-CM | POA: Diagnosis not present

## 2023-02-06 DIAGNOSIS — E291 Testicular hypofunction: Secondary | ICD-10-CM | POA: Diagnosis not present

## 2023-02-20 DIAGNOSIS — E291 Testicular hypofunction: Secondary | ICD-10-CM | POA: Diagnosis not present

## 2023-03-06 DIAGNOSIS — E291 Testicular hypofunction: Secondary | ICD-10-CM | POA: Diagnosis not present

## 2023-03-20 DIAGNOSIS — E291 Testicular hypofunction: Secondary | ICD-10-CM | POA: Diagnosis not present

## 2023-03-26 DIAGNOSIS — J301 Allergic rhinitis due to pollen: Secondary | ICD-10-CM | POA: Diagnosis not present

## 2023-04-04 DIAGNOSIS — Z6837 Body mass index (BMI) 37.0-37.9, adult: Secondary | ICD-10-CM | POA: Diagnosis not present

## 2023-04-04 DIAGNOSIS — E291 Testicular hypofunction: Secondary | ICD-10-CM | POA: Diagnosis not present

## 2023-04-04 DIAGNOSIS — E669 Obesity, unspecified: Secondary | ICD-10-CM | POA: Diagnosis not present

## 2023-04-04 DIAGNOSIS — F411 Generalized anxiety disorder: Secondary | ICD-10-CM | POA: Diagnosis not present

## 2023-04-04 DIAGNOSIS — I10 Essential (primary) hypertension: Secondary | ICD-10-CM | POA: Diagnosis not present

## 2023-04-17 DIAGNOSIS — E291 Testicular hypofunction: Secondary | ICD-10-CM | POA: Diagnosis not present

## 2023-04-27 DIAGNOSIS — E291 Testicular hypofunction: Secondary | ICD-10-CM | POA: Diagnosis not present

## 2023-05-07 DIAGNOSIS — R112 Nausea with vomiting, unspecified: Secondary | ICD-10-CM | POA: Diagnosis not present

## 2023-05-07 DIAGNOSIS — R42 Dizziness and giddiness: Secondary | ICD-10-CM | POA: Diagnosis not present

## 2023-05-07 DIAGNOSIS — J302 Other seasonal allergic rhinitis: Secondary | ICD-10-CM | POA: Diagnosis not present

## 2023-05-11 DIAGNOSIS — E291 Testicular hypofunction: Secondary | ICD-10-CM | POA: Diagnosis not present

## 2023-05-29 DIAGNOSIS — E291 Testicular hypofunction: Secondary | ICD-10-CM | POA: Diagnosis not present

## 2023-06-11 DIAGNOSIS — E291 Testicular hypofunction: Secondary | ICD-10-CM | POA: Diagnosis not present

## 2023-06-26 DIAGNOSIS — E291 Testicular hypofunction: Secondary | ICD-10-CM | POA: Diagnosis not present

## 2023-07-10 DIAGNOSIS — Z125 Encounter for screening for malignant neoplasm of prostate: Secondary | ICD-10-CM | POA: Diagnosis not present

## 2023-07-10 DIAGNOSIS — E291 Testicular hypofunction: Secondary | ICD-10-CM | POA: Diagnosis not present

## 2023-07-10 DIAGNOSIS — I10 Essential (primary) hypertension: Secondary | ICD-10-CM | POA: Diagnosis not present

## 2023-07-10 DIAGNOSIS — Z6835 Body mass index (BMI) 35.0-35.9, adult: Secondary | ICD-10-CM | POA: Diagnosis not present

## 2023-07-10 DIAGNOSIS — K219 Gastro-esophageal reflux disease without esophagitis: Secondary | ICD-10-CM | POA: Diagnosis not present

## 2023-07-10 DIAGNOSIS — E669 Obesity, unspecified: Secondary | ICD-10-CM | POA: Diagnosis not present

## 2023-07-10 DIAGNOSIS — F411 Generalized anxiety disorder: Secondary | ICD-10-CM | POA: Diagnosis not present

## 2023-07-24 DIAGNOSIS — E291 Testicular hypofunction: Secondary | ICD-10-CM | POA: Diagnosis not present

## 2023-08-07 DIAGNOSIS — E291 Testicular hypofunction: Secondary | ICD-10-CM | POA: Diagnosis not present

## 2023-08-17 DIAGNOSIS — J301 Allergic rhinitis due to pollen: Secondary | ICD-10-CM | POA: Diagnosis not present

## 2023-08-21 DIAGNOSIS — E291 Testicular hypofunction: Secondary | ICD-10-CM | POA: Diagnosis not present

## 2023-09-04 DIAGNOSIS — E291 Testicular hypofunction: Secondary | ICD-10-CM | POA: Diagnosis not present

## 2023-09-18 DIAGNOSIS — E291 Testicular hypofunction: Secondary | ICD-10-CM | POA: Diagnosis not present

## 2023-10-02 DIAGNOSIS — Z23 Encounter for immunization: Secondary | ICD-10-CM | POA: Diagnosis not present

## 2023-10-02 DIAGNOSIS — E291 Testicular hypofunction: Secondary | ICD-10-CM | POA: Diagnosis not present

## 2023-10-16 DIAGNOSIS — E291 Testicular hypofunction: Secondary | ICD-10-CM | POA: Diagnosis not present

## 2023-10-30 DIAGNOSIS — E291 Testicular hypofunction: Secondary | ICD-10-CM | POA: Diagnosis not present

## 2023-11-13 DIAGNOSIS — E291 Testicular hypofunction: Secondary | ICD-10-CM | POA: Diagnosis not present

## 2023-11-20 DIAGNOSIS — E669 Obesity, unspecified: Secondary | ICD-10-CM | POA: Diagnosis not present

## 2023-11-20 DIAGNOSIS — Z0000 Encounter for general adult medical examination without abnormal findings: Secondary | ICD-10-CM | POA: Diagnosis not present

## 2023-11-20 DIAGNOSIS — Z1389 Encounter for screening for other disorder: Secondary | ICD-10-CM | POA: Diagnosis not present

## 2023-11-20 DIAGNOSIS — Z6837 Body mass index (BMI) 37.0-37.9, adult: Secondary | ICD-10-CM | POA: Diagnosis not present

## 2023-11-20 DIAGNOSIS — I10 Essential (primary) hypertension: Secondary | ICD-10-CM | POA: Diagnosis not present

## 2023-11-27 DIAGNOSIS — E291 Testicular hypofunction: Secondary | ICD-10-CM | POA: Diagnosis not present

## 2023-12-11 DIAGNOSIS — E291 Testicular hypofunction: Secondary | ICD-10-CM | POA: Diagnosis not present

## 2023-12-25 DIAGNOSIS — E291 Testicular hypofunction: Secondary | ICD-10-CM | POA: Diagnosis not present

## 2024-01-08 DIAGNOSIS — Z6837 Body mass index (BMI) 37.0-37.9, adult: Secondary | ICD-10-CM | POA: Diagnosis not present

## 2024-01-08 DIAGNOSIS — E669 Obesity, unspecified: Secondary | ICD-10-CM | POA: Diagnosis not present

## 2024-01-08 DIAGNOSIS — F411 Generalized anxiety disorder: Secondary | ICD-10-CM | POA: Diagnosis not present

## 2024-01-08 DIAGNOSIS — C61 Malignant neoplasm of prostate: Secondary | ICD-10-CM | POA: Diagnosis not present

## 2024-01-08 DIAGNOSIS — I10 Essential (primary) hypertension: Secondary | ICD-10-CM | POA: Diagnosis not present

## 2024-01-08 DIAGNOSIS — K219 Gastro-esophageal reflux disease without esophagitis: Secondary | ICD-10-CM | POA: Diagnosis not present

## 2024-01-08 DIAGNOSIS — E291 Testicular hypofunction: Secondary | ICD-10-CM | POA: Diagnosis not present

## 2024-01-29 DIAGNOSIS — E291 Testicular hypofunction: Secondary | ICD-10-CM | POA: Diagnosis not present

## 2024-02-06 DIAGNOSIS — C61 Malignant neoplasm of prostate: Secondary | ICD-10-CM | POA: Diagnosis not present

## 2024-02-14 DIAGNOSIS — N5201 Erectile dysfunction due to arterial insufficiency: Secondary | ICD-10-CM | POA: Diagnosis not present

## 2024-02-14 DIAGNOSIS — N503 Cyst of epididymis: Secondary | ICD-10-CM | POA: Diagnosis not present

## 2024-02-14 DIAGNOSIS — E291 Testicular hypofunction: Secondary | ICD-10-CM | POA: Diagnosis not present

## 2024-02-14 DIAGNOSIS — C61 Malignant neoplasm of prostate: Secondary | ICD-10-CM | POA: Diagnosis not present

## 2024-02-18 ENCOUNTER — Other Ambulatory Visit: Payer: Self-pay | Admitting: Urology

## 2024-02-18 DIAGNOSIS — N503 Cyst of epididymis: Secondary | ICD-10-CM

## 2024-02-19 DIAGNOSIS — E291 Testicular hypofunction: Secondary | ICD-10-CM | POA: Diagnosis not present

## 2024-02-22 ENCOUNTER — Ambulatory Visit
Admission: RE | Admit: 2024-02-22 | Discharge: 2024-02-22 | Disposition: A | Discharge status: Home / Self Care | Source: Ambulatory Visit | Attending: Urology | Admitting: Urology

## 2024-02-22 DIAGNOSIS — N503 Cyst of epididymis: Secondary | ICD-10-CM

## 2024-03-04 DIAGNOSIS — R062 Wheezing: Secondary | ICD-10-CM | POA: Diagnosis not present

## 2024-03-04 DIAGNOSIS — J069 Acute upper respiratory infection, unspecified: Secondary | ICD-10-CM | POA: Diagnosis not present

## 2024-03-11 DIAGNOSIS — E291 Testicular hypofunction: Secondary | ICD-10-CM | POA: Diagnosis not present

## 2024-03-25 DIAGNOSIS — E291 Testicular hypofunction: Secondary | ICD-10-CM | POA: Diagnosis not present

## 2024-04-08 DIAGNOSIS — E291 Testicular hypofunction: Secondary | ICD-10-CM | POA: Diagnosis not present

## 2024-04-08 DIAGNOSIS — M1711 Unilateral primary osteoarthritis, right knee: Secondary | ICD-10-CM | POA: Diagnosis not present

## 2024-04-22 DIAGNOSIS — E291 Testicular hypofunction: Secondary | ICD-10-CM | POA: Diagnosis not present

## 2024-05-06 DIAGNOSIS — E291 Testicular hypofunction: Secondary | ICD-10-CM | POA: Diagnosis not present

## 2024-05-20 DIAGNOSIS — E291 Testicular hypofunction: Secondary | ICD-10-CM | POA: Diagnosis not present

## 2024-06-03 DIAGNOSIS — E291 Testicular hypofunction: Secondary | ICD-10-CM | POA: Diagnosis not present

## 2024-06-17 DIAGNOSIS — E291 Testicular hypofunction: Secondary | ICD-10-CM | POA: Diagnosis not present

## 2024-07-01 DIAGNOSIS — E291 Testicular hypofunction: Secondary | ICD-10-CM | POA: Diagnosis not present

## 2024-07-08 DIAGNOSIS — E291 Testicular hypofunction: Secondary | ICD-10-CM | POA: Diagnosis not present

## 2024-07-08 DIAGNOSIS — I10 Essential (primary) hypertension: Secondary | ICD-10-CM | POA: Diagnosis not present

## 2024-07-08 DIAGNOSIS — F411 Generalized anxiety disorder: Secondary | ICD-10-CM | POA: Diagnosis not present

## 2024-07-08 DIAGNOSIS — K219 Gastro-esophageal reflux disease without esophagitis: Secondary | ICD-10-CM | POA: Diagnosis not present

## 2024-07-08 DIAGNOSIS — E669 Obesity, unspecified: Secondary | ICD-10-CM | POA: Diagnosis not present

## 2024-07-08 DIAGNOSIS — Z6837 Body mass index (BMI) 37.0-37.9, adult: Secondary | ICD-10-CM | POA: Diagnosis not present

## 2024-07-08 DIAGNOSIS — M1711 Unilateral primary osteoarthritis, right knee: Secondary | ICD-10-CM | POA: Diagnosis not present

## 2024-07-14 DIAGNOSIS — M1711 Unilateral primary osteoarthritis, right knee: Secondary | ICD-10-CM | POA: Diagnosis not present

## 2024-07-15 DIAGNOSIS — E291 Testicular hypofunction: Secondary | ICD-10-CM | POA: Diagnosis not present

## 2024-07-29 DIAGNOSIS — E291 Testicular hypofunction: Secondary | ICD-10-CM | POA: Diagnosis not present

## 2024-08-12 DIAGNOSIS — E291 Testicular hypofunction: Secondary | ICD-10-CM | POA: Diagnosis not present

## 2024-08-22 DIAGNOSIS — E291 Testicular hypofunction: Secondary | ICD-10-CM | POA: Diagnosis not present

## 2024-09-02 DIAGNOSIS — E291 Testicular hypofunction: Secondary | ICD-10-CM | POA: Diagnosis not present

## 2024-09-16 DIAGNOSIS — E291 Testicular hypofunction: Secondary | ICD-10-CM | POA: Diagnosis not present

## 2024-09-30 DIAGNOSIS — E291 Testicular hypofunction: Secondary | ICD-10-CM | POA: Diagnosis not present

## 2024-09-30 DIAGNOSIS — Z23 Encounter for immunization: Secondary | ICD-10-CM | POA: Diagnosis not present

## 2024-10-13 DIAGNOSIS — R9431 Abnormal electrocardiogram [ECG] [EKG]: Secondary | ICD-10-CM | POA: Diagnosis not present

## 2024-10-13 DIAGNOSIS — Z79899 Other long term (current) drug therapy: Secondary | ICD-10-CM | POA: Diagnosis not present

## 2024-10-13 DIAGNOSIS — M79609 Pain in unspecified limb: Secondary | ICD-10-CM | POA: Diagnosis not present

## 2024-10-13 DIAGNOSIS — E559 Vitamin D deficiency, unspecified: Secondary | ICD-10-CM | POA: Diagnosis not present

## 2024-10-13 DIAGNOSIS — Z01818 Encounter for other preprocedural examination: Secondary | ICD-10-CM | POA: Diagnosis not present

## 2024-10-14 DIAGNOSIS — E291 Testicular hypofunction: Secondary | ICD-10-CM | POA: Diagnosis not present

## 2024-10-14 DIAGNOSIS — Z01818 Encounter for other preprocedural examination: Secondary | ICD-10-CM | POA: Diagnosis not present

## 2024-10-14 DIAGNOSIS — Z23 Encounter for immunization: Secondary | ICD-10-CM | POA: Diagnosis not present

## 2024-10-22 DIAGNOSIS — M1711 Unilateral primary osteoarthritis, right knee: Secondary | ICD-10-CM | POA: Diagnosis not present

## 2024-10-28 DIAGNOSIS — K219 Gastro-esophageal reflux disease without esophagitis: Secondary | ICD-10-CM | POA: Diagnosis not present

## 2024-10-28 DIAGNOSIS — F411 Generalized anxiety disorder: Secondary | ICD-10-CM | POA: Diagnosis not present

## 2024-10-28 DIAGNOSIS — Z6837 Body mass index (BMI) 37.0-37.9, adult: Secondary | ICD-10-CM | POA: Diagnosis not present

## 2024-10-28 DIAGNOSIS — Z01810 Encounter for preprocedural cardiovascular examination: Secondary | ICD-10-CM | POA: Diagnosis not present

## 2024-10-28 DIAGNOSIS — I10 Essential (primary) hypertension: Secondary | ICD-10-CM | POA: Diagnosis not present

## 2024-10-28 DIAGNOSIS — E669 Obesity, unspecified: Secondary | ICD-10-CM | POA: Diagnosis not present

## 2024-10-28 DIAGNOSIS — E291 Testicular hypofunction: Secondary | ICD-10-CM | POA: Diagnosis not present

## 2024-11-04 DIAGNOSIS — Z23 Encounter for immunization: Secondary | ICD-10-CM | POA: Diagnosis not present

## 2024-11-04 DIAGNOSIS — E291 Testicular hypofunction: Secondary | ICD-10-CM | POA: Diagnosis not present

## 2024-11-06 DIAGNOSIS — Z6839 Body mass index (BMI) 39.0-39.9, adult: Secondary | ICD-10-CM | POA: Diagnosis not present

## 2024-11-06 DIAGNOSIS — Z79899 Other long term (current) drug therapy: Secondary | ICD-10-CM | POA: Diagnosis not present

## 2024-11-06 DIAGNOSIS — I10 Essential (primary) hypertension: Secondary | ICD-10-CM | POA: Diagnosis not present

## 2024-11-06 DIAGNOSIS — K219 Gastro-esophageal reflux disease without esophagitis: Secondary | ICD-10-CM | POA: Diagnosis not present

## 2024-11-06 DIAGNOSIS — M1711 Unilateral primary osteoarthritis, right knee: Secondary | ICD-10-CM | POA: Diagnosis not present

## 2024-11-06 DIAGNOSIS — E669 Obesity, unspecified: Secondary | ICD-10-CM | POA: Diagnosis not present
